# Patient Record
Sex: Male | Born: 1965 | Race: White | Hispanic: No | Marital: Married | Smoking: Never smoker
Health system: Southern US, Community
[De-identification: ages and names within clinical notes are randomized; demographics above are authoritative.]

## PROBLEM LIST (undated history)

## (undated) DIAGNOSIS — N210 Calculus in bladder: Secondary | ICD-10-CM

## (undated) DIAGNOSIS — R531 Weakness: Secondary | ICD-10-CM

## (undated) DIAGNOSIS — M549 Dorsalgia, unspecified: Secondary | ICD-10-CM

## (undated) DIAGNOSIS — G8220 Paraplegia, unspecified: Secondary | ICD-10-CM

## (undated) DIAGNOSIS — Z8709 Personal history of other diseases of the respiratory system: Secondary | ICD-10-CM

## (undated) DIAGNOSIS — Z9289 Personal history of other medical treatment: Secondary | ICD-10-CM

## (undated) HISTORY — PX: HERNIA REPAIR: SHX51

## (undated) HISTORY — PX: OTHER SURGICAL HISTORY: SHX169

## (undated) HISTORY — DX: Paraplegia, unspecified: G82.20

## (undated) HISTORY — PX: FRACTURE SURGERY: SHX138

---

## 2013-10-09 ENCOUNTER — Ambulatory Visit (INDEPENDENT_AMBULATORY_CARE_PROVIDER_SITE_OTHER): Payer: BC Managed Care – PPO

## 2013-10-09 ENCOUNTER — Ambulatory Visit (INDEPENDENT_AMBULATORY_CARE_PROVIDER_SITE_OTHER): Payer: BC Managed Care – PPO | Admitting: Family Medicine

## 2013-10-09 VITALS — BP 120/72 | HR 56 | Temp 97.7°F | Resp 18

## 2013-10-09 DIAGNOSIS — S82132A Displaced fracture of medial condyle of left tibia, initial encounter for closed fracture: Secondary | ICD-10-CM | POA: Insufficient documentation

## 2013-10-09 DIAGNOSIS — M25469 Effusion, unspecified knee: Secondary | ICD-10-CM

## 2013-10-09 DIAGNOSIS — S99929A Unspecified injury of unspecified foot, initial encounter: Secondary | ICD-10-CM

## 2013-10-09 DIAGNOSIS — S8990XA Unspecified injury of unspecified lower leg, initial encounter: Secondary | ICD-10-CM

## 2013-10-09 DIAGNOSIS — G839 Paralytic syndrome, unspecified: Secondary | ICD-10-CM

## 2013-10-09 DIAGNOSIS — S82142A Displaced bicondylar fracture of left tibia, initial encounter for closed fracture: Secondary | ICD-10-CM

## 2013-10-09 DIAGNOSIS — S82109A Unspecified fracture of upper end of unspecified tibia, initial encounter for closed fracture: Secondary | ICD-10-CM

## 2013-10-09 DIAGNOSIS — G8220 Paraplegia, unspecified: Secondary | ICD-10-CM

## 2013-10-09 DIAGNOSIS — S99919A Unspecified injury of unspecified ankle, initial encounter: Secondary | ICD-10-CM
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
## 2013-10-09 NOTE — Patient Instructions (Addendum)
There appears to be a break in the inside part of the left tibia/knee.  We will discuss this with ortho and give you a call today. Keep ace bandage in place and minimal movement of this knee as possible until further treatment discussed.

## 2013-10-13 ENCOUNTER — Ambulatory Visit (INDEPENDENT_AMBULATORY_CARE_PROVIDER_SITE_OTHER): Payer: BC Managed Care – PPO | Admitting: Family Medicine

## 2013-10-13 VITALS — HR 48 | Temp 97.3°F | Resp 14

## 2013-10-13 DIAGNOSIS — R319 Hematuria, unspecified: Principal | ICD-10-CM

## 2013-10-13 DIAGNOSIS — N390 Urinary tract infection, site not specified: Secondary | ICD-10-CM

## 2013-10-13 LAB — POCT URINALYSIS DIPSTICK
Bilirubin, UA: NEGATIVE
Glucose, UA: NEGATIVE
NITRITE UA: POSITIVE
PH UA: 7
PROTEIN UA: 30
Spec Grav, UA: 1.02
UROBILINOGEN UA: 1

## 2013-10-13 LAB — POCT UA - MICROSCOPIC ONLY
CASTS, UR, LPF, POC: NEGATIVE
Crystals, Ur, HPF, POC: NEGATIVE
Mucus, UA: NEGATIVE
YEAST UA: NEGATIVE

## 2013-10-13 MED ORDER — NITROFURANTOIN MONOHYD MACRO 100 MG PO CAPS: 100.0000 mg | ORAL_CAPSULE | Freq: Two times a day (BID) | ORAL | Status: DC

## 2013-10-13 NOTE — Patient Instructions (Signed)
It does look and sound like you have a UTI.  We will send this for culture.  Take the Macrobid twice daily for 7 days.    If you start having nausea, vomiting, or feeling worse, don't hesitate to come back.   It was good to meet you today
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
## 2013-10-16 LAB — URINE CULTURE

## 2013-10-22 ENCOUNTER — Ambulatory Visit (INDEPENDENT_AMBULATORY_CARE_PROVIDER_SITE_OTHER): Payer: BC Managed Care – PPO

## 2013-10-22 ENCOUNTER — Ambulatory Visit (INDEPENDENT_AMBULATORY_CARE_PROVIDER_SITE_OTHER): Payer: BC Managed Care – PPO | Admitting: Family Medicine

## 2013-10-22 ENCOUNTER — Encounter: Payer: Self-pay | Admitting: Family Medicine

## 2013-10-22 ENCOUNTER — Other Ambulatory Visit: Payer: Self-pay | Admitting: Family Medicine

## 2013-10-22 VITALS — BP 128/72 | HR 57 | Temp 98.8°F | Resp 16

## 2013-10-22 DIAGNOSIS — S82132D Displaced fracture of medial condyle of left tibia, subsequent encounter for closed fracture with routine healing: Secondary | ICD-10-CM

## 2013-10-22 DIAGNOSIS — S8290XD Unspecified fracture of unspecified lower leg, subsequent encounter for closed fracture with routine healing: Secondary | ICD-10-CM

## 2013-10-22 NOTE — Patient Instructions (Signed)
Xray looks overall stable.  recheck in 4 weeks, or if channges noted - return in 2 weeks.  I will look into provider for assessing wheelchair replacement/fitting and physical medicine and rehab provider.  Return to the clinic or go to the nearest emergency room if any of your symptoms worsen or new symptoms occur.
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
## 2013-11-01 ENCOUNTER — Ambulatory Visit (INDEPENDENT_AMBULATORY_CARE_PROVIDER_SITE_OTHER): Payer: BC Managed Care – PPO | Admitting: Family Medicine

## 2013-11-01 VITALS — BP 126/70 | HR 65 | Temp 98.0°F | Resp 16

## 2013-11-01 DIAGNOSIS — N390 Urinary tract infection, site not specified: Secondary | ICD-10-CM

## 2013-11-01 DIAGNOSIS — T83511A Infection and inflammatory reaction due to indwelling urethral catheter, initial encounter: Secondary | ICD-10-CM

## 2013-11-01 LAB — POCT URINALYSIS DIPSTICK
Bilirubin, UA: NEGATIVE
Glucose, UA: NEGATIVE
Ketones, UA: NEGATIVE
NITRITE UA: NEGATIVE
PH UA: 5
PROTEIN UA: NEGATIVE
Spec Grav, UA: <=1.005
Urobilinogen, UA: 0.2

## 2013-11-01 LAB — POCT UA - MICROSCOPIC ONLY
Casts, Ur, LPF, POC: NEGATIVE
Crystals, Ur, HPF, POC: NEGATIVE
Yeast, UA: NEGATIVE

## 2013-11-01 MED ORDER — CIPROFLOXACIN HCL 500 MG PO TABS: 500.0000 mg | ORAL_TABLET | Freq: Two times a day (BID) | ORAL | Status: DC

## 2013-11-01 NOTE — Patient Instructions (Addendum)
We will start antibiotic - I will call you with which one. You should receive a call or letter about your lab results within the next week to 10 days.  If dark urine persists tomorrow or next day - return as we may need to check blood test for muscle enzyme. Return to the clinic or go to the nearest emergency room if any of your symptoms worsen or new symptoms occur.  If infection recurs in next 2 months - will plan on urology evaluation.  Urinary Tract Infection Urinary tract infections (UTIs) can develop anywhere along your urinary tract. Your urinary tract is your body's drainage system for removing wastes and extra water. Your urinary tract includes two kidneys, two ureters, a bladder, and a urethra. Your kidneys are a pair of bean-shaped organs. Each kidney is about the size of your fist. They are located below your ribs, one on each side of your spine. CAUSES Infections are caused by microbes, which are microscopic organisms, including fungi, viruses, and bacteria. These organisms are so small that they can only be seen through a microscope. Bacteria are the microbes that most commonly cause UTIs. SYMPTOMS  Symptoms of UTIs may vary by age and gender of the patient and by the location of the infection. Symptoms in young women typically include a frequent and intense urge to urinate and a painful, burning feeling in the bladder or urethra during urination. Older women and men are more likely to be tired, shaky, and weak and have muscle aches and abdominal pain. A fever may mean the infection is in your kidneys. Other symptoms of a kidney infection include pain in your back or sides below the ribs, nausea, and vomiting. DIAGNOSIS To diagnose a UTI, your caregiver will ask you about your symptoms. Your caregiver also will ask to provide a urine sample. The urine sample will be tested for bacteria and white blood cells. White blood cells are made by your body to help fight infection. TREATMENT    Typically, UTIs can be treated with medication. Because most UTIs are caused by a bacterial infection, they usually can be treated with the use of antibiotics. The choice of antibiotic and length of treatment depend on your symptoms and the type of bacteria causing your infection. HOME CARE INSTRUCTIONS  If you were prescribed antibiotics, take them exactly as your caregiver instructs you. Finish the medication even if you feel better after you have only taken some of the medication.  Drink enough water and fluids to keep your urine clear or pale yellow.  Avoid caffeine, tea, and carbonated beverages. They tend to irritate your bladder.  Empty your bladder often. Avoid holding urine for long periods of time.  Empty your bladder before and after sexual intercourse.  After a bowel movement, women should cleanse from front to back. Use each tissue only once. SEEK MEDICAL CARE IF:   You have back pain.  You develop a fever.  Your symptoms do not begin to resolve within 3 days. SEEK IMMEDIATE MEDICAL CARE IF:   You have severe back pain or lower abdominal pain.  You develop chills.  You have nausea or vomiting.  You have continued burning or discomfort with urination. MAKE SURE YOU:   Understand these instructions.  Will watch your condition.  Will get help right away if you are not doing well or get worse. Document Released: 01/20/2005 Document Revised: 10/12/2011 Document Reviewed: 05/21/2011 [facility] Patient Information 2015 [place]. This information is not intended to replace  advice given to you by your health care provider. Make sure you discuss any questions you have with your health care provider.  

## 2013-11-01 NOTE — Progress Notes (Addendum)
Subjective:  This chart was scribed for [name], MD by Andrew Au, ED Scribe. This patient was seen in room 14 and the patient's care was started at 10:09 AM.  Authored by Silas Sacramento, MD - unable to change in [facility].   Patient ID: Charles Hansen, male    DOB: 1965-08-15, 48 y.o.   MRN: [id number]  Urinary Tract Infection  Pertinent negatives include no chills or hematuria.   Chief Complaint  Patient presents with   Urinary Tract Infection   HPI Comments: Charles Hansen is a 48 y.o. male who presents to the [facility] complaining of a UTI x  Seen was diagnosed with UTI on June 20 by Dr. Gordy Levan. He had cloudy and foul smelling urine 3-4 days at that time. H/o T9 peroplasia. self catheterizes. Pos culture for klebsiella. sensitive to Macrobid that he was prescribed 7 day coarse.  Today, pt states this bladder infection is worse than past infection.  He reports clear urine 1 day ago but woke up today with cloudy, tea color urine with associated lower back muscle spasm. Pt reports self catheterizes about 4-6 times day. Pt denies taking OTC medication. He reports he's been off 7 day coarse for a couple weeks and reports he did well with medication. Pt report h/o bladder stone. Pt denies h/o kidney stones. Pt reports working out 1 day ago but denies extreme changes in activity.  Pt denies decreased in urine.   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 No pertinent past medical history. Past Surgical History  Procedure Laterality Date   Fracture surgery     Hernia repair     Allergies  Allergen Reactions   Bactrim [Sulfamethoxazole-Tmp Ds] Hives and Rash   Prior to Admission medications   Medication Sig Start Date End Date Taking? Authorizing Provider  nitrofurantoin, macrocrystal-monohydrate, (MACROBID) 100 MG capsule Take 1 capsule (100 mg total) by mouth 2 (two) times daily. 10/13/13    Tobey Grim, MD   History   Social History   Marital Status: Married    Spouse Name: N/A    Number of Children: N/A   Years of Education: N/A   Occupational History   Not on file.   Social History Main Topics   Smoking status: Never Smoker    Smokeless tobacco: Not on file   Alcohol Use: No   Drug Use: No   Sexual Activity: Not on file   Other Topics Concern   Not on file   Social History Narrative   No narrative on file   Review of Systems  Constitutional: Negative for fever and chills.  Genitourinary: Negative for hematuria, decreased urine volume and difficulty urinating.      Objective:   Physical Exam  Nursing note and vitals reviewed. Constitutional: He is oriented to person, place, and time. He appears well-developed and well-nourished. No distress.  HENT:  Head: Normocephalic and atraumatic.  Eyes: Conjunctivae and EOM are normal.  Neck: Neck supple.  Cardiovascular: Normal rate.   Pulmonary/Chest: Effort normal.  Abdominal: Soft. Bowel sounds are normal. There is no tenderness. There is no CVA tenderness.  Musculoskeletal: Normal range of motion.  Neurological: He is alert and oriented to person, place, and time.  Skin: Skin is warm and dry.  Psychiatric: He has a normal mood and affect. His behavior is normal.   Results for orders placed in visit on 11/01/13  POCT URINALYSIS DIPSTICK      Result Value Ref Range   Color, UA yellow     Clarity, UA cloudy     Glucose, UA neg     Bilirubin, UA neg     Ketones, UA neg     Spec Grav, UA <=1.005     Blood, UA large     pH, UA 5.0     Protein, UA neg     Urobilinogen, UA 0.2     Nitrite, UA neg     Leukocytes, UA large (3+)    POCT UA - MICROSCOPIC ONLY      Result Value Ref Range   WBC, Ur, HPF, POC TNTC     RBC, urine, microscopic 5-7     Bacteria, U Microscopic 1+     Mucus, UA trace     Epithelial cells, urine per micros 0-1     Crystals, Ur, HPF, POC neg     Casts, Ur, LPF,  POC neg     Yeast, UA neg         Assessment & Plan:   Charles Hansen is a 48 y.o. male Urinary tract infection associated with catheterization of urinary tract, initial encounter - Plan: POCT urinalysis dipstick, POCT UA - Microscopic Only, Urine culture, ciprofloxacin (CIPRO) 500 MG tablet   - recurrent uti vs new episode. Urine cx pending, start cipro - 10 day course, and if not improving in few days - especially if dark urine persists, rtc for eval.  Consider CK level if persistent dark urine in few days. If another recurrence in next 2 months - consider urology eval for recurrent uti.   rtc precautions.    Meds ordered this encounter  Medications   ciprofloxacin (CIPRO) 500 MG tablet    Sig: Take 1 tablet (500 mg total) by mouth 2 (two) times daily.    Dispense:  20 tablet    Refill:  0   Patient Instructions  We will start antibiotic - I will call you with which one. You should receive a call or letter about your lab results within the next week to 10 days.  If dark urine persists tomorrow or next day - return as we may need to check blood test for muscle enzyme. Return to the clinic or go to the nearest emergency room if any of your symptoms worsen or new symptoms occur.  If infection recurs in next 2 months - will plan on urology evaluation.  Urinary Tract Infection Urinary tract infections (UTIs) can develop anywhere along your urinary tract. Your urinary tract is your body's drainage system for removing wastes and extra water. Your urinary tract includes two kidneys, two ureters, a bladder, and a urethra. Your kidneys are a pair of bean-shaped organs. Each kidney is about the size of your fist. They are located below your ribs, one on each side of your spine. CAUSES Infections are caused by microbes, which are microscopic organisms, including fungi, viruses, and bacteria. These organisms are so small that they can only be seen through a microscope. Bacteria are the microbes  that most commonly cause UTIs. SYMPTOMS  Symptoms of UTIs may vary by age and gender of the patient and by the location of the infection. Symptoms in young women typically include a frequent and intense urge to urinate and a painful, burning feeling in the bladder or urethra during urination. Older women and men are more likely to be tired, shaky, and weak and have muscle aches  and abdominal pain. A fever may mean the infection is in your kidneys. Other symptoms of a kidney infection include pain in your back or sides below the ribs, nausea, and vomiting. DIAGNOSIS To diagnose a UTI, your caregiver will ask you about your symptoms. Your caregiver also will ask to provide a urine sample. The urine sample will be tested for bacteria and white blood cells. White blood cells are made by your body to help fight infection. TREATMENT  Typically, UTIs can be treated with medication. Because most UTIs are caused by a bacterial infection, they usually can be treated with the use of antibiotics. The choice of antibiotic and length of treatment depend on your symptoms and the type of bacteria causing your infection. HOME CARE INSTRUCTIONS  If you were prescribed antibiotics, take them exactly as your caregiver instructs you. Finish the medication even if you feel better after you have only taken some of the medication.  Drink enough water and fluids to keep your urine clear or pale yellow.  Avoid caffeine, tea, and carbonated beverages. They tend to irritate your bladder.  Empty your bladder often. Avoid holding urine for long periods of time.  Empty your bladder before and after sexual intercourse.  After a bowel movement, women should cleanse from front to back. Use each tissue only once. SEEK MEDICAL CARE IF:   You have back pain.  You develop a fever.  Your symptoms do not begin to resolve within 3 days. SEEK IMMEDIATE MEDICAL CARE IF:   You have severe back pain or lower abdominal pain.  You  develop chills.  You have nausea or vomiting.  You have continued burning or discomfort with urination. MAKE SURE YOU:   Understand these instructions.  Will watch your condition.  Will get help right away if you are not doing well or get worse. Document Released: 01/20/2005 Document Revised: 10/12/2011 Document Reviewed: 05/21/2011 [facility] Patient Information 2015 [place], [place]. This information is not intended to replace advice given to you by your health care provider. Make sure you discuss any questions you have with your health care provider.   I personally performed the services described in this documentation, which was scribed in my presence. The recorded information has been reviewed and considered, and addended by me as needed.

## 2013-11-03 LAB — URINE CULTURE

## 2013-11-19 ENCOUNTER — Encounter: Payer: Self-pay | Admitting: Family Medicine

## 2013-11-19 ENCOUNTER — Ambulatory Visit (INDEPENDENT_AMBULATORY_CARE_PROVIDER_SITE_OTHER): Payer: BC Managed Care – PPO | Admitting: Family Medicine

## 2013-11-19 ENCOUNTER — Ambulatory Visit (INDEPENDENT_AMBULATORY_CARE_PROVIDER_SITE_OTHER): Payer: BC Managed Care – PPO

## 2013-11-19 VITALS — BP 120/60 | HR 58 | Temp 98.2°F | Resp 16

## 2013-11-19 DIAGNOSIS — S82132D Displaced fracture of medial condyle of left tibia, subsequent encounter for closed fracture with routine healing: Secondary | ICD-10-CM

## 2013-11-19 DIAGNOSIS — G8220 Paraplegia, unspecified: Secondary | ICD-10-CM

## 2013-11-19 DIAGNOSIS — G839 Paralytic syndrome, unspecified: Secondary | ICD-10-CM

## 2013-11-19 DIAGNOSIS — S8290XD Unspecified fracture of unspecified lower leg, subsequent encounter for closed fracture with routine healing: Secondary | ICD-10-CM

## 2013-11-19 NOTE — Progress Notes (Signed)
Subjective:  This chart was scribed for Charles StaggersJeffrey Kallista Pae, MD by Charles Hansen, Medial Scribe. The patient's care was started at 2:08 PM.    Patient ID: Charles Hansen, male    DOB: 02/27/1966, [age] y.o.   MRN: [id number]  HPI HPI Comments: Charles Hansen is a 48 y.o. male PCP: No PCP Per Patient  Left knee injury Patient presents to the [facility] requesting follow up for left knee injury incurred 10/06/13. Patient diagnosed with medial tibial plateau fracture. Repeat X-ray 10/22/13 overall stable without apparent displacement. Today patient is 6 weeks post injury. Patient generally reports improvement of the left knee. States that he is diligent about wearing his compression sock and reports decreased swelling. He however reports continued sensitivity that he notices when touching the knee.   Urinary Tract Infection Patient denies futher urinary symptoms.      Patient Active Problem List   Diagnosis Date Noted  . 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 Past Surgical History  Procedure Laterality Date  . Fracture surgery    . Hernia repair     Allergies  Allergen Reactions  . Bactrim [Sulfamethoxazole-Tmp Ds] Hives and Rash   Prior to Admission medications   Medication Sig Start Date End Date Taking? Authorizing Provider  ciprofloxacin (CIPRO) 500 MG tablet Take 1 tablet (500 mg total) by mouth 2 (two) times daily. 11/01/13   Charles FloodJeffrey R Alquan Morrish, MD  nitrofurantoin, macrocrystal-monohydrate, (MACROBID) 100 MG capsule Take 1 capsule (100 mg total) by mouth 2 (two) times daily. 10/13/13   Charles GrimJeffrey H Walden, MD   History   Social History  . Marital Status: Married    Spouse Name: N/A    Number of Children: N/A  . Years of Education: N/A   Occupational History  . Not on file.   Social History Main Topics  . Smoking status: Never Smoker   . Smokeless tobacco: Not on file  . Alcohol Use: No  . Drug Use: No  . Sexual Activity: Not on  file   Other Topics Concern  . Not on file   Social History Narrative  . No narrative on file      Review of Systems  Constitutional: Negative for fever and chills.  Gastrointestinal: Negative for nausea.  Genitourinary: Negative for dysuria, urgency and frequency.  Musculoskeletal: Positive for joint swelling. Negative for back pain.       Objective:   Physical Exam  Nursing note and vitals reviewed. Constitutional: He is oriented to person, place, and time. He appears well-developed and well-nourished. No distress.  Patient is in wheel chair.   HENT:  Head: Normocephalic and atraumatic.  Eyes: EOM are normal.  Neck: Neck supple.  Cardiovascular: Normal rate.   Pulmonary/Chest: Effort normal. No respiratory distress.  Musculoskeletal: He exhibits no edema.  Left knee: Faint effusion. Able to passively extend to lacking to approximately 30 deg with underlying contracture. Full flexion. No ecchymosis. Skin is intact. No edema under the knee.  Toes warm. Cap refill at toes < 1 sec.   Neurological: He is alert and oriented to person, place, and time.  Skin: Skin is warm and dry.  Psychiatric: He has a normal mood and affect. His behavior is normal.    Filed Vitals:   11/19/13 1344  BP: 120/60  Pulse: 58  Temp: 98.2 F (36.8 C)  TempSrc: Oral  Resp: 16  SpO2: 99%    [facility] reading (PRIMARY) by  Dr.  Neva Seat. Left knee: left medial tibial plateau fracture with callous formation. No apparent displacement or interval change.    Reading from radiology: FINDINGS:  There is a nondisplaced medial tibial plateau fracture. There is  diffuse osteopenia. There is no significant change when compared to  the prior study. Stable nondisplaced medial tibial plateau fracture.       Assessment & Plan:  Charles Hansen is a 48 y.o. male Medial plateau fracture, left, closed, with routine healing, subsequent encounter - Plan: DG Knee Complete 4 Views Left,   -stable. Plan on  continued relative avoidance of moving this knee and caution with transfers. Plan on repeat eval in 4 weeks.   Paraplegia at T9 level - Plan: DME Wheelchair manual, Ambulatory Referral to Neuro Rehab  -needs new W/C.  Referred to [facility] for selection of W?C and the if needed to neuro rehab for chair fitting.   No orders of the defined types were placed in this encounter.   Patient Instructions  We will place an order for [facility] to choose wheelchair vendor, then once that is chosen, you will need an evaluation with neurorehab.  We will go ahead and place those orders as neurorehab appointments are booking out several weeks at this point. Let me know if any questions.   Your knee fracture appears stable.  Stocking only if needed for swelling, continue to be cautious in transfers and movement of this joint for the next few weeks.  I will watch for the xray reading to see if we need another follow up in about 4 weeks.   Return to the clinic or go to the nearest emergency room if any of your symptoms worsen or new symptoms occur.

## 2013-11-19 NOTE — Patient Instructions (Signed)
We will place an order for [facility] to choose wheelchair vendor, then once that is chosen, you will need an evaluation with neurorehab.  We will go ahead and place those orders as neurorehab appointments are booking out several weeks at this point. Let me know if any questions.   Your knee fracture appears stable.  Stocking only if needed for swelling, continue to be cautious in transfers and movement of this joint for the next few weeks.  I will watch for the xray reading to see if we need another follow up in about 4 weeks.   Return to the clinic or go to the nearest emergency room if any of your symptoms worsen or new symptoms occur.

## 2013-11-21 ENCOUNTER — Encounter: Payer: Self-pay | Admitting: Family Medicine

## 2013-12-24 ENCOUNTER — Ambulatory Visit (INDEPENDENT_AMBULATORY_CARE_PROVIDER_SITE_OTHER): Payer: BC Managed Care – PPO

## 2013-12-24 ENCOUNTER — Encounter: Payer: Self-pay | Admitting: Family Medicine

## 2013-12-24 ENCOUNTER — Ambulatory Visit (INDEPENDENT_AMBULATORY_CARE_PROVIDER_SITE_OTHER): Payer: BC Managed Care – PPO | Admitting: Family Medicine

## 2013-12-24 VITALS — BP 115/59 | HR 58 | Temp 98.6°F | Resp 16 | Wt 128.6 lb

## 2013-12-24 DIAGNOSIS — S8290XS Unspecified fracture of unspecified lower leg, sequela: Secondary | ICD-10-CM

## 2013-12-24 DIAGNOSIS — S82132S Displaced fracture of medial condyle of left tibia, sequela: Secondary | ICD-10-CM

## 2013-12-24 NOTE — Progress Notes (Signed)
Subjective:  This chart was scribed for Charles Staggers, MD by Carl Best, Medical Scribe. This patient was seen in Room 24 and the patient's care was started at 3:50 PM.   Patient ID: Charles Hansen, male    DOB: 11/18/1965, 48 y.o.   MRN: [id number]  HPI HPI Comments: Charles Hansen is a 48 y.o. male who presents to the [facility] for a follow-up.  He has a history of paraplegia at T9.  Seen initially for left medial tibial plateau fracture that occurred 10/06/13.  Most recent x-ray was on July 27th.  The fracture was stable and nondisplaced without significant changes.  Here for repeat x-ray.    There have been no changes in the swelling to his left knee.  He is not experiencing any problems and his range of motion has improved.  The piloerection and diaphoresis are gradually resolving.  He has not worn the compression stocking for the last 2-3 weeks.  He has not had any left ankle swelling for the last couple of weeks.    Patient Active Problem List   Diagnosis Date Noted  . Paraplegia at T9 level 10/09/2013  . Closed fracture of medial plateau of left tibia 10/09/2013   No past medical history on file. Past Surgical History  Procedure Laterality Date  . Fracture surgery    . Hernia repair     Allergies  Allergen Reactions  . Bactrim [Sulfamethoxazole-Tmp Ds] Hives and Rash   Prior to Admission medications   Medication Sig Start Date End Date Taking? Authorizing Provider  ciprofloxacin (CIPRO) 500 MG tablet Take 1 tablet (500 mg total) by mouth 2 (two) times daily. 11/01/13   Shade Flood, MD  nitrofurantoin, macrocrystal-monohydrate, (MACROBID) 100 MG capsule Take 1 capsule (100 mg total) by mouth 2 (two) times daily. 10/13/13   Tobey Grim, MD   History   Social History  . Marital Status: Married    Spouse Name: N/A    Number of Children: N/A  . Years of Education: N/A   Occupational History  . Not on file.   Social History Main Topics  .  Smoking status: Never Smoker   . Smokeless tobacco: Not on file  . Alcohol Use: No  . Drug Use: No  . Sexual Activity: Not on file   Other Topics Concern  . Not on file   Social History Narrative  . No narrative on file     Review of Systems  Musculoskeletal: Positive for joint swelling.    Objective:  Physical Exam  Vitals reviewed. Constitutional: He is oriented to person, place, and time. He appears well-developed and well-nourished.  HENT:  Head: Normocephalic and atraumatic.  Eyes: EOM are normal. Pupils are equal, round, and reactive to light.  Neck: No JVD present. Carotid bruit is not present.  Cardiovascular: Normal rate, regular rhythm and normal heart sounds.   No murmur heard. Pulmonary/Chest: Effort normal and breath sounds normal. He has no rales.  Musculoskeletal: He exhibits no edema.  Left knee - no significant effusion.  Skin is intact.  No dependent edema.  No ecchymosis.  Intact ROM.     Neurological: He is alert and oriented to person, place, and time.  Skin: Skin is warm and dry.  Psychiatric: He has a normal mood and affect.   [facility] preliminary x-ray report read by Dr. Neva Seat: Left knee.  Medial tibia plateau fracture.  Stable with possible small area of callous.  No apparent change  from prior study.   Filed Vitals:   12/24/13 1453  BP: 115/59  Pulse: 58  Temp: 98.6 F (37 C)  TempSrc: Oral  Resp: 16  Weight: 128 lb 9.6 oz (58.333 kg)  SpO2: 97%   Assessment & Plan:   Charles Hansen is a 48 y.o. male Closed fracture of medial plateau of left tibia, sequela - Plan: DG Knee Complete 4 Views Left  -appears stable on XR, clinically stable.  No new changes.  likely can defer further imaging at this point. overread pending.   appt pending for new wheelchair fitting as below.  No orders of the defined types were placed in this encounter.   Patient Instructions  [facility] can see you for the Wheelchair evaluation on October 22nd  at 8:45 am / the appointment is with Robin. They are located at [address] [employer] [address]. You will get some information in the mail concerning this appointment.   No further xray needed at this point, but if new swelling or other changes - let me know.    I personally performed the services described in this documentation, which was scribed in my presence. The recorded information has been reviewed and considered, and addended by me as needed.

## 2013-12-24 NOTE — Patient Instructions (Addendum)
[facility] can see you for the Wheelchair evaluation on October 22nd at 8:45 am / the appointment is with Robin. They are located at [address] [employer] [address]. You will get some information in the mail concerning this appointment.   No further xray needed at this point, but if new swelling or other changes - let me know.

## 2014-02-14 ENCOUNTER — Ambulatory Visit: Payer: 59 | Admitting: Physical Therapy

## 2014-04-11 ENCOUNTER — Ambulatory Visit: Payer: 59 | Attending: Family Medicine | Admitting: Physical Therapy

## 2014-04-11 DIAGNOSIS — R293 Abnormal posture: Secondary | ICD-10-CM

## 2014-04-11 DIAGNOSIS — G839 Paralytic syndrome, unspecified: Secondary | ICD-10-CM | POA: Insufficient documentation

## 2014-04-11 DIAGNOSIS — G8220 Paraplegia, unspecified: Secondary | ICD-10-CM

## 2014-04-11 NOTE — Therapy (Signed)
[facility] [address] [place], [place], [address] Phone: [phone]   Fax:  [phone]  Patient Details  Name: Charles Hansen MRN: [id number] Date of Birth: 03/17/1966  Encounter Date: 04/11/2014  See scanned assessment for complete wheelchair evaluation for this date of service.  PT awaiting complete wheelchair component specifications upon pt's additional meeting with [facility], [facility].  [employer] W. 04/11/2014, 2:06 PM   Jahir Halt, PT 04/11/2014 2:08 PM Phone: [phone] Fax: [phone]

## 2014-05-22 ENCOUNTER — Telehealth: Payer: Self-pay

## 2014-05-22 DIAGNOSIS — G8220 Paraplegia, unspecified: Secondary | ICD-10-CM

## 2014-05-22 MED ORDER — UNABLE TO FIND: Status: DC

## 2014-05-22 NOTE — Telephone Encounter (Signed)
Pt LM on VM asking Dr Neva SeatGreene if he can write a Rx for hand controls to be put on auto. He can take off taxes if he has a Rx w/ Dx of paraplegia. I have pended a Rx for review.

## 2014-05-22 NOTE — Telephone Encounter (Signed)
Notified pt ready. [facility]ed to [employer] [facility] [employer] who are installing hand controls and put orig Rx in p/up drawer for pt per pt req.

## 2014-05-22 NOTE — Telephone Encounter (Signed)
Done.,  Can sign later today when in office. Thanks. -JG

## 2014-08-05 ENCOUNTER — Ambulatory Visit (INDEPENDENT_AMBULATORY_CARE_PROVIDER_SITE_OTHER): Payer: 59 | Admitting: Family Medicine

## 2014-08-05 ENCOUNTER — Encounter: Payer: Self-pay | Admitting: Family Medicine

## 2014-08-05 VITALS — BP 113/67 | HR 60 | Temp 98.6°F | Resp 16 | Wt 131.0 lb

## 2014-08-05 DIAGNOSIS — M4127 Other idiopathic scoliosis, lumbosacral region: Secondary | ICD-10-CM | POA: Diagnosis not present

## 2014-08-05 DIAGNOSIS — G839 Paralytic syndrome, unspecified: Secondary | ICD-10-CM | POA: Diagnosis not present

## 2014-08-05 DIAGNOSIS — M955 Acquired deformity of pelvis: Secondary | ICD-10-CM

## 2014-08-05 DIAGNOSIS — M67912 Unspecified disorder of synovium and tendon, left shoulder: Secondary | ICD-10-CM

## 2014-08-05 DIAGNOSIS — M419 Scoliosis, unspecified: Secondary | ICD-10-CM

## 2014-08-05 DIAGNOSIS — M25512 Pain in left shoulder: Secondary | ICD-10-CM | POA: Diagnosis not present

## 2014-08-05 DIAGNOSIS — G8220 Paraplegia, unspecified: Secondary | ICD-10-CM

## 2014-08-05 NOTE — Progress Notes (Addendum)
Subjective:  This chart was scribed for [name], MD by [facility], medical scribe at [facility].The patient was seen in exam room 24 and the patient's care was started at 3:13 PM.   Patient ID: Charles Hansen, male    DOB: [birth year], 49 y.o.   MRN: [id number]   Chief Complaint  Patient presents with  . Hip Pain    feels like hips are shifting and legs are starting to sweat  . Shoulder Pain    left shoulder pain that is constant   HPI  HPI Comments: Charles Hansen is a 49 y.o. male who presents to [facility] complaining of an abnormal hip sensation and left shoulder pain. 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 He uses a wheelchair.   Abnormal Hip Sensation: Last 8 weeks he has noticed a pelvic tilt to left and slightly inward. He is unsure when this actually happened. This has happened before between 2004-2006 the pt states he "adapted" and did not have any chiropractor work done. He has increased leg spasms, sweating, and abdominal muscle tension associated with the tilt.  Left Shoulder Pain: Pt states he has pain with certain movements and is gradually worsening. He believes it is due to increased repetitive motions. He is ambidextrous. He has notice decreased ROM. Pt denies trauma. Pt also complains of intermittent numbness in his hands, he denies neck pain. Aleve has provided relief, which he takes 1-2 a week. No history of stomach ulcers.  Patient Active Problem List   Diagnosis Date Noted  . Paraplegia at T9 level 10/09/2013  . Closed fracture of medial plateau of left tibia 10/09/2013   No past medical history on file. Past Surgical History  Procedure Laterality Date  . Fracture surgery    . Hernia repair     Allergies  Allergen Reactions  . Bactrim [Sulfamethoxazole-Trimethoprim] Hives and Rash   Prior to Admission medications   Medication Sig Start Date End Date Taking? Authorizing Provider  UNABLE TO  FIND Hand controls for automobile. Dx code: G83.9 05/22/14   Shade Flood, MD   History   Social History  . Marital Status: Married    Spouse Name: N/A  . Number of Children: N/A  . Years of Education: N/A   Occupational History  . Not on file.   Social History Main Topics  . Smoking status: Never Smoker   . Smokeless tobacco: Not on file  . Alcohol Use: No  . Drug Use: No  . Sexual Activity: Not on file   Other Topics Concern  . Not on file   Social History Narrative   Review of Systems  Musculoskeletal: Positive for arthralgias. Negative for neck pain.  Neurological: Positive for numbness.      Objective:  BP 113/67 mmHg  Pulse 60  Temp(Src) 98.6 F (37 C) (Oral)  Resp 16  Wt 131 lb (59.421 kg)  SpO2 98% Physical Exam  Constitutional: He is oriented to person, place, and time. He appears well-developed and well-nourished. No distress.  HENT:  Head: Normocephalic and atraumatic.  Eyes: Pupils are equal, round, and reactive to light.  Neck: Normal range of motion.  Cardiovascular: Normal rate and regular rhythm.   Pulmonary/Chest: Effort normal. No respiratory distress.  Musculoskeletal: Normal range of motion.  On frontal view: left pelvis seems to be tilted downward and slightly inward at the AS IS node compared to the right. There is  an Intact healed scar from upper thoracic to the upper lumbar area with bony prominence of the upper lumbar region with forward flexion.  There is also a well healed scar at the inferior SI area from a bone graft. Slight bony prominence  underlying the scar under the SI area with prominence in this area inferior to midline. Posteriorly the sacrum also appears lower than the right with forward flexion of the trunk. Pain of left arm with full abduction and external rotation In the pronation position he has noticed more pain. C-spine full ROM, no reproduction of motion with C-spine rotation. Full ROM of bilateral shoulders with  slight reproduction of pain with external rotation of the left shoulder. Positive O'Brien but minimal change with pronation, negative cross over, negative Neer's, negative Hawkins, negative empty can.  Neurological: He is alert and oriented to person, place, and time.  Reflex Scores:      Tricep reflexes are 2+ on the right side and 2+ on the left side.      Bicep reflexes are 2+ on the right side and 2+ on the left side.      Brachioradialis reflexes are 2+ on the right side and 2+ on the left side. Skin: Skin is warm and dry.  Psychiatric: He has a normal mood and affect. His behavior is normal.  Nursing note and vitals reviewed.     Assessment & Plan:  Charles Hansen is a 49 y.o. 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 Now with development of pelvic tilt over past 8 weeks and some somatic changes on extremities that may be in response to change in pelvic positioning.   -discussed with ortho/spine specialist.  Will refer to Dr. Yevette Edwardsumonski at [facility] for evaluation and mgt options.  We deferred XR in office as NKI, and will have imaging at ortho. Called pt and advised plan Tuesday 08/06/14.   Pain in joint, shoulder region, left, Tendinopathy of rotator cuff, left  - suspected RTC tendinosis/tendininitis by hx.  Positive obriens on exam - DDX includes labral pathology/injury. Options discussed.  Will start with otc alleve, HEP by handout for rotator cuff. If not improving in next few weeks - consider imaging and possible injection at that time.   Will also be returning for new wheelchair fitting with rep from wheelchair company and PT/OT if needed. Can schedule as appt (preferably 30min appt) if needed. He will advise me of this.  No orders of the defined types were placed in this encounter.   Patient Instructions  I will talk with ortho to determine if back specialist would be next step in evaluation of the  shift in your pelvis.  Try alleve for shoulder pain, exercises below and if not improving in next 3-4 weeks - consider xray and possible injection.   Let me know if my help is needed in scheduling the new wheelchair fitting.   Return to the clinic or go to the nearest emergency room if any of your symptoms worsen or new symptoms occur.  Rotator Cuff Tendinitis  Rotator cuff tendinitis is inflammation of the tough, cord-like bands that connect muscle to bone (tendons) in your rotator cuff. Your rotator cuff is the collection of all the muscles and tendons that connect your arm to your shoulder. Your rotator cuff holds the head of your upper arm bone (humerus) in the cup (fossa) of your shoulder blade (scapula). CAUSES Rotator cuff tendinitis is  usually caused by overusing the joint involved.  SIGNS AND SYMPTOMS  Deep ache in the shoulder also felt on the outside upper arm over the shoulder muscle.  Point tenderness over the area that is injured.  Pain comes on gradually and becomes worse with lifting the arm to the side (abduction) or turning it inward (internal rotation).  May lead to a chronic tear: When a rotator cuff tendon becomes inflamed, it runs the risk of losing its blood supply, causing some tendon fibers to die. This increases the risk that the tendon can fray and partially or completely tear. DIAGNOSIS Rotator cuff tendinitis is diagnosed by taking a medical history, performing a physical exam, and reviewing results of imaging exams. The medical history is useful to help determine the type of rotator cuff injury. The physical exam will include looking at the injured shoulder, feeling the injured area, and watching you do range-of-motion exercises. X-ray exams are typically done to rule out other causes of shoulder pain, such as fractures. MRI is the imaging exam usually used for significant shoulder injuries. Sometimes a dye study called CT arthrogram is done, but it is not as  widely used as MRI. In some institutions, special ultrasound tests may also be used to aid in the diagnosis. TREATMENT  Less Severe Cases  Use of a sling to rest the shoulder for a short period of time. Prolonged use of the sling can cause stiffness, weakness, and loss of motion of the shoulder joint.  Anti-inflammatory medicines, such as ibuprofen or naproxen sodium, may be prescribed. More Severe Cases  Physical therapy.  Use of steroid injections into the shoulder joint.  Surgery. HOME CARE INSTRUCTIONS   Use a sling or splint until the pain decreases. Prolonged use of the sling can cause stiffness, weakness, and loss of motion of the shoulder joint.  Apply ice to the injured area:  Put ice in a plastic bag.  Place a towel between your skin and the bag.  Leave the ice on for 20 minutes, 2-3 times a day.  Try to avoid use other than gentle range of motion while your shoulder is painful. Use the shoulder and exercise only as directed by your health care provider. Stop exercises or range of motion if pain or discomfort increases, unless directed otherwise by your health care provider.  Only take over-the-counter or prescription medicines for pain, discomfort, or fever as directed by your health care provider.  If you were given a shoulder sling and straps (immobilizer), do not remove it except as directed, or until you see a health care provider for a follow-up exam. If you need to remove it, move your arm as little as possible or as directed.  You may want to sleep on several pillows at night to lessen swelling and pain. SEEK IMMEDIATE MEDICAL CARE IF:   Your shoulder pain increases or new pain develops in your arm, hand, or fingers and is not relieved with medicines.  You have new, unexplained symptoms, especially increased numbness in the hands or loss of strength.  You develop any worsening of the problems that brought you in for care.  Your arm, hand, or fingers are numb  or tingling.  Your arm, hand, or fingers are swollen, painful, or turn white or blue. MAKE SURE YOU:  Understand these instructions.  Will watch your condition.  Will get help right away if you are not doing well or get worse. Document Released: 07/03/2003 Document Revised: 01/31/2013 Document Reviewed: 11/22/2012 ExitCare Patient  Information 2015 [place], [place]. This information is not intended to replace advice given to you by your health care provider. Make sure you discuss any questions you have with your health care provider.  Impingement Syndrome, Rotator Cuff, Bursitis with Rehab Impingement syndrome is a condition that involves inflammation of the tendons of the rotator cuff and the subacromial bursa, that causes pain in the shoulder. The rotator cuff consists of four tendons and muscles that control much of the shoulder and upper arm function. The subacromial bursa is a fluid filled sac that helps reduce friction between the rotator cuff and one of the bones of the shoulder (acromion). Impingement syndrome is usually an overuse injury that causes swelling of the bursa (bursitis), swelling of the tendon (tendonitis), and/or a tear of the tendon (strain). Strains are classified into three categories. Grade 1 strains cause pain, but the tendon is not lengthened. Grade 2 strains include a lengthened ligament, due to the ligament being stretched or partially ruptured. With grade 2 strains there is still function, although the function may be decreased. Grade 3 strains include a complete tear of the tendon or muscle, and function is usually impaired. SYMPTOMS   Pain around the shoulder, often at the outer portion of the upper arm.  Pain that gets worse with shoulder function, especially when reaching overhead or lifting.  Sometimes, aching when not using the arm.  Pain that wakes you up at night.  Sometimes, tenderness, swelling, warmth, or redness over the affected area.  Loss of  strength.  Limited motion of the shoulder, especially reaching behind the back (to the back pocket or to unhook bra) or across your body.  Crackling sound (crepitation) when moving the arm.  Biceps tendon pain and inflammation (in the front of the shoulder). Worse when bending the elbow or lifting. CAUSES  Impingement syndrome is often an overuse injury, in which chronic (repetitive) motions cause the tendons or bursa to become inflamed. A strain occurs when a force is paced on the tendon or muscle that is greater than it can withstand. Common mechanisms of injury include: Stress from sudden increase in duration, frequency, or intensity of training.  Direct hit (trauma) to the shoulder.  Aging, erosion of the tendon with normal use.  Bony bump on shoulder (acromial spur). RISK INCREASES WITH:  Contact sports (football, wrestling, boxing).  Throwing sports (baseball, tennis, volleyball).  Weightlifting and bodybuilding.  Heavy labor.  Previous injury to the rotator cuff, including impingement.  Poor shoulder strength and flexibility.  Failure to warm up properly before activity.  Inadequate protective equipment.  Old age.  Bony bump on shoulder (acromial spur). PREVENTION   Warm up and stretch properly before activity.  Allow for adequate recovery between workouts.  Maintain physical fitness:  Strength, flexibility, and endurance.  Cardiovascular fitness.  Learn and use proper exercise technique. PROGNOSIS  If treated properly, impingement syndrome usually goes away within 6 weeks. Sometimes surgery is required.  RELATED COMPLICATIONS   Longer healing time if not properly treated, or if not given enough time to heal.  Recurring symptoms, that result in a chronic condition.  Shoulder stiffness, frozen shoulder, or loss of motion.  Rotator cuff tendon tear.  Recurring symptoms, especially if activity is resumed too soon, with overuse, with a direct blow, or  when using poor technique. TREATMENT  Treatment first involves the use of ice and medicine, to reduce pain and inflammation. The use of strengthening and stretching exercises may help reduce pain with activity. These  exercises may be performed at home or with a therapist. If non-surgical treatment is unsuccessful after more than 6 months, surgery may be advised. After surgery and rehabilitation, activity is usually possible in 3 months.  MEDICATION  If pain medicine is needed, nonsteroidal anti-inflammatory medicines (aspirin and ibuprofen), or other minor pain relievers (acetaminophen), are often advised.  Do not take pain medicine for 7 days before surgery.  Prescription pain relievers may be given, if your caregiver thinks they are needed. Use only as directed and only as much as you need.  Corticosteroid injections may be given by your caregiver. These injections should be reserved for the most serious cases, because they may only be given a certain number of times. HEAT AND COLD  Cold treatment (icing) should be applied for 10 to 15 minutes every 2 to 3 hours for inflammation and pain, and immediately after activity that aggravates your symptoms. Use ice packs or an ice massage.  Heat treatment may be used before performing stretching and strengthening activities prescribed by your caregiver, physical therapist, or athletic trainer. Use a heat pack or a warm water soak. SEEK MEDICAL CARE IF:   Symptoms get worse or do not improve in 4 to 6 weeks, despite treatment.  New, unexplained symptoms develop. (Drugs used in treatment may produce side effects.) EXERCISES  RANGE OF MOTION (ROM) AND STRETCHING EXERCISES - Impingement Syndrome (Rotator Cuff  Tendinitis, Bursitis) These exercises may help you when beginning to rehabilitate your injury. Your symptoms may go away with or without further involvement from your physician, physical therapist or athletic trainer. While completing these  exercises, remember:   Restoring tissue flexibility helps normal motion to return to the joints. This allows healthier, less painful movement and activity.  An effective stretch should be held for at least 30 seconds.  A stretch should never be painful. You should only feel a gentle lengthening or release in the stretched tissue. STRETCH - Flexion, Standing  Stand with good posture. With an underhand grip on your right / left hand, and an overhand grip on the opposite hand, grasp a broomstick or cane so that your hands are a little more than shoulder width apart.  Keeping your right / left elbow straight and shoulder muscles relaxed, push the stick with your opposite hand, to raise your right / left arm in front of your body and then overhead. Raise your arm until you feel a stretch in your right / left shoulder, but before you have increased shoulder pain.  Try to avoid shrugging your right / left shoulder as your arm rises, by keeping your shoulder blade tucked down and toward your mid-back spine. Hold for __________ seconds.  Slowly return to the starting position. Repeat __________ times. Complete this exercise __________ times per day. STRETCH - Abduction, Supine  Lie on your back. With an underhand grip on your right / left hand and an overhand grip on the opposite hand, grasp a broomstick or cane so that your hands are a little more than shoulder width apart.  Keeping your right / left elbow straight and your shoulder muscles relaxed, push the stick with your opposite hand, to raise your right / left arm out to the side of your body and then overhead. Raise your arm until you feel a stretch in your right / left shoulder, but before you have increased shoulder pain.  Try to avoid shrugging your right / left shoulder as your arm rises, by keeping your shoulder blade tucked down  and toward your mid-back spine. Hold for __________ seconds.  Slowly return to the starting position. Repeat  __________ times. Complete this exercise __________ times per day. ROM - Flexion, Active-Assisted  Lie on your back. You may bend your knees for comfort.  Grasp a broomstick or cane so your hands are about shoulder width apart. Your right / left hand should grip the end of the stick, so that your hand is positioned "thumbs-up," as if you were about to shake hands.  Using your healthy arm to lead, raise your right / left arm overhead, until you feel a gentle stretch in your shoulder. Hold for __________ seconds.  Use the stick to assist in returning your right / left arm to its starting position. Repeat __________ times. Complete this exercise __________ times per day.  ROM - Internal Rotation, Supine   Lie on your back on a firm surface. Place your right / left elbow about 60 degrees away from your side. Elevate your elbow with a folded towel, so that the elbow and shoulder are the same height.  Using a broomstick or cane and your strong arm, pull your right / left hand toward your body until you feel a gentle stretch, but no increase in your shoulder pain. Keep your shoulder and elbow in place throughout the exercise.  Hold for __________ seconds. Slowly return to the starting position. Repeat __________ times. Complete this exercise __________ times per day. STRETCH - Internal Rotation  Place your right / left hand behind your back, palm up.  Throw a towel or belt over your opposite shoulder. Grasp the towel with your right / left hand.  While keeping an upright posture, gently pull up on the towel, until you feel a stretch in the front of your right / left shoulder.  Avoid shrugging your right / left shoulder as your arm rises, by keeping your shoulder blade tucked down and toward your mid-back spine.  Hold for __________ seconds. Release the stretch, by lowering your healthy hand. Repeat __________ times. Complete this exercise __________ times per day. ROM - Internal Rotation    Using an underhand grip, grasp a stick behind your back with both hands.  While standing upright with good posture, slide the stick up your back until you feel a mild stretch in the front of your shoulder.  Hold for __________ seconds. Slowly return to your starting position. Repeat __________ times. Complete this exercise __________ times per day.  STRETCH - Posterior Shoulder Capsule   Stand or sit with good posture. Grasp your right / left elbow and draw it across your chest, keeping it at the same height as your shoulder.  Pull your elbow, so your upper arm comes in closer to your chest. Pull until you feel a gentle stretch in the back of your shoulder.  Hold for __________ seconds. Repeat __________ times. Complete this exercise __________ times per day. STRENGTHENING EXERCISES - Impingement Syndrome (Rotator Cuff Tendinitis, Bursitis) These exercises may help you when beginning to rehabilitate your injury. They may resolve your symptoms with or without further involvement from your physician, physical therapist or athletic trainer. While completing these exercises, remember:  Muscles can gain both the endurance and the strength needed for everyday activities through controlled exercises.  Complete these exercises as instructed by your physician, physical therapist or athletic trainer. Increase the resistance and repetitions only as guided.  You may experience muscle soreness or fatigue, but the pain or discomfort you are trying to eliminate should never  worsen during these exercises. If this pain does get worse, stop and make sure you are following the directions exactly. If the pain is still present after adjustments, discontinue the exercise until you can discuss the trouble with your clinician.  During your recovery, avoid activity or exercises which involve actions that place your injured hand or elbow above your head or behind your back or head. These positions stress the  tissues which you are trying to heal. STRENGTH - Scapular Depression and Adduction   With good posture, sit on a firm chair. Support your arms in front of you, with pillows, arm rests, or on a table top. Have your elbows in line with the sides of your body.  Gently draw your shoulder blades down and toward your mid-back spine. Gradually increase the tension, without tensing the muscles along the top of your shoulders and the back of your neck.  Hold for __________ seconds. Slowly release the tension and relax your muscles completely before starting the next repetition.  After you have practiced this exercise, remove the arm support and complete the exercise in standing as well as sitting position. Repeat __________ times. Complete this exercise __________ times per day.  STRENGTH - Shoulder Abductors, Isometric  With good posture, stand or sit about 4-6 inches from a wall, with your right / left side facing the wall.  Bend your right / left elbow. Gently press your right / left elbow into the wall. Increase the pressure gradually, until you are pressing as hard as you can, without shrugging your shoulder or increasing any shoulder discomfort.  Hold for __________ seconds.  Release the tension slowly. Relax your shoulder muscles completely before you begin the next repetition. Repeat __________ times. Complete this exercise __________ times per day.  STRENGTH - External Rotators, Isometric  Keep your right / left elbow at your side and bend it 90 degrees.  Step into a door frame so that the outside of your right / left wrist can press against the door frame without your upper arm leaving your side.  Gently press your right / left wrist into the door frame, as if you were trying to swing the back of your hand away from your stomach. Gradually increase the tension, until you are pressing as hard as you can, without shrugging your shoulder or increasing any shoulder discomfort.  Hold for  __________ seconds.  Release the tension slowly. Relax your shoulder muscles completely before you begin the next repetition. Repeat __________ times. Complete this exercise __________ times per day.  STRENGTH - Supraspinatus   Stand or sit with good posture. Grasp a __________ weight, or an exercise band or tubing, so that your hand is "thumbs-up," like you are shaking hands.  Slowly lift your right / left arm in a "V" away from your thigh, diagonally into the space between your side and straight ahead. Lift your hand to shoulder height or as far as you can, without increasing any shoulder pain. At first, many people do not lift their hands above shoulder height.  Avoid shrugging your right / left shoulder as your arm rises, by keeping your shoulder blade tucked down and toward your mid-back spine.  Hold for __________ seconds. Control the descent of your hand, as you slowly return to your starting position. Repeat __________ times. Complete this exercise __________ times per day.  STRENGTH - External Rotators  Secure a rubber exercise band or tubing to a fixed object (table, pole) so that it is at the  same height as your right / left elbow when you are standing or sitting on a firm surface.  Stand or sit so that the secured exercise band is at your uninjured side.  Bend your right / left elbow 90 degrees. Place a folded towel or small pillow under your right / left arm, so that your elbow is a few inches away from your side.  Keeping the tension on the exercise band, pull it away from your body, as if pivoting on your elbow. Be sure to keep your body steady, so that the movement is coming only from your rotating shoulder.  Hold for __________ seconds. Release the tension in a controlled manner, as you return to the starting position. Repeat __________ times. Complete this exercise __________ times per day.  STRENGTH - Internal Rotators   Secure a rubber exercise band or tubing to a  fixed object (table, pole) so that it is at the same height as your right / left elbow when you are standing or sitting on a firm surface.  Stand or sit so that the secured exercise band is at your right / left side.  Bend your elbow 90 degrees. Place a folded towel or small pillow under your right / left arm so that your elbow is a few inches away from your side.  Keeping the tension on the exercise band, pull it across your body, toward your stomach. Be sure to keep your body steady, so that the movement is coming only from your rotating shoulder.  Hold for __________ seconds. Release the tension in a controlled manner, as you return to the starting position. Repeat __________ times. Complete this exercise __________ times per day.  STRENGTH - Scapular Protractors, Standing   Stand arms length away from a wall. Place your hands on the wall, keeping your elbows straight.  Begin by dropping your shoulder blades down and toward your mid-back spine.  To strengthen your protractors, keep your shoulder blades down, but slide them forward on your rib cage. It will feel as if you are lifting the back of your rib cage away from the wall. This is a subtle motion and can be challenging to complete. Ask your caregiver for further instruction, if you are not sure you are doing the exercise correctly.  Hold for __________ seconds. Slowly return to the starting position, resting the muscles completely before starting the next repetition. Repeat __________ times. Complete this exercise __________ times per day. STRENGTH - Scapular Protractors, Supine  Lie on your back on a firm surface. Extend your right / left arm straight into the air while holding a __________ weight in your hand.  Keeping your head and back in place, lift your shoulder off the floor.  Hold for __________ seconds. Slowly return to the starting position, and allow your muscles to relax completely before starting the next  repetition. Repeat __________ times. Complete this exercise __________ times per day. STRENGTH - Scapular Protractors, Quadruped  Get onto your hands and knees, with your shoulders directly over your hands (or as close as you can be, comfortably).  Keeping your elbows locked, lift the back of your rib cage up into your shoulder blades, so your mid-back rounds out. Keep your neck muscles relaxed.  Hold this position for __________ seconds. Slowly return to the starting position and allow your muscles to relax completely before starting the next repetition. Repeat __________ times. Complete this exercise __________ times per day.  STRENGTH - Scapular Retractors  Secure a rubber  exercise band or tubing to a fixed object (table, pole), so that it is at the height of your shoulders when you are either standing, or sitting on a firm armless chair.  With a palm down grip, grasp an end of the band in each hand. Straighten your elbows and lift your hands straight in front of you, at shoulder height. Step back, away from the secured end of the band, until it becomes tense.  Squeezing your shoulder blades together, draw your elbows back toward your sides, as you bend them. Keep your upper arms lifted away from your body throughout the exercise.  Hold for __________ seconds. Slowly ease the tension on the band, as you reverse the directions and return to the starting position. Repeat __________ times. Complete this exercise __________ times per day. STRENGTH - Shoulder Extensors   Secure a rubber exercise band or tubing to a fixed object (table, pole) so that it is at the height of your shoulders when you are either standing, or sitting on a firm armless chair.  With a thumbs-up grip, grasp an end of the band in each hand. Straighten your elbows and lift your hands straight in front of you, at shoulder height. Step back, away from the secured end of the band, until it becomes tense.  Squeezing your  shoulder blades together, pull your hands down to the sides of your thighs. Do not allow your hands to go behind you.  Hold for __________ seconds. Slowly ease the tension on the band, as you reverse the directions and return to the starting position. Repeat __________ times. Complete this exercise __________ times per day.  STRENGTH - Scapular Retractors and External Rotators   Secure a rubber exercise band or tubing to a fixed object (table, pole) so that it is at the height as your shoulders, when you are either standing, or sitting on a firm armless chair.  With a palm down grip, grasp an end of the band in each hand. Bend your elbows 90 degrees and lift your elbows to shoulder height, at your sides. Step back, away from the secured end of the band, until it becomes tense.  Squeezing your shoulder blades together, rotate your shoulders so that your upper arms and elbows remain stationary, but your fists travel upward to head height.  Hold for __________ seconds. Slowly ease the tension on the band, as you reverse the directions and return to the starting position. Repeat __________ times. Complete this exercise __________ times per day.  STRENGTH - Scapular Retractors and External Rotators, Rowing   Secure a rubber exercise band or tubing to a fixed object (table, pole) so that it is at the height of your shoulders, when you are either standing, or sitting on a firm armless chair.  With a palm down grip, grasp an end of the band in each hand. Straighten your elbows and lift your hands straight in front of you, at shoulder height. Step back, away from the secured end of the band, until it becomes tense.  Step 1: Squeeze your shoulder blades together. Bending your elbows, draw your hands to your chest, as if you are rowing a boat. At the end of this motion, your hands and elbow should be at shoulder height and your elbows should be out to your sides.  Step 2: Rotate your shoulders, to raise  your hands above your head. Your forearms should be vertical and your upper arms should be horizontal.  Hold for __________ seconds. Slowly ease  the tension on the band, as you reverse the directions and return to the starting position. Repeat __________ times. Complete this exercise __________ times per day.  STRENGTH - Scapular Depressors  Find a sturdy chair without wheels, such as a dining room chair.  Keeping your feet on the floor, and your hands on the chair arms, lift your bottom up from the seat, and lock your elbows.  Keeping your elbows straight, allow gravity to pull your body weight down. Your shoulders will rise toward your ears.  Raise your body against gravity by drawing your shoulder blades down your back, shortening the distance between your shoulders and ears. Although your feet should always maintain contact with the floor, your feet should progressively support less body weight, as you get stronger.  Hold for __________ seconds. In a controlled and slow manner, lower your body weight to begin the next repetition. Repeat __________ times. Complete this exercise __________ times per day.  Document Released: 04/12/2005 Document Revised: 07/05/2011 Document Reviewed: 07/25/2008 [facility] Patient Information 2015 [place], [place]. This information is not intended to replace advice given to you by your health care provider. Make sure you discuss any questions you have with your health care provider.     I personally performed the services described in this documentation, which was scribed in my presence. The recorded information has been reviewed and considered, and addended by me as needed.

## 2014-08-05 NOTE — Patient Instructions (Signed)
I will talk with ortho to determine if back specialist would be next step in evaluation of the shift in your pelvis.  Try alleve for shoulder pain, exercises below and if not improving in next 3-4 weeks - consider xray and possible injection.   Let me know if my help is needed in scheduling the new wheelchair fitting.   Return to the clinic or go to the nearest emergency room if any of your symptoms worsen or new symptoms occur.  Rotator Cuff Tendinitis  Rotator cuff tendinitis is inflammation of the tough, cord-like bands that connect muscle to bone (tendons) in your rotator cuff. Your rotator cuff is the collection of all the muscles and tendons that connect your arm to your shoulder. Your rotator cuff holds the head of your upper arm bone (humerus) in the cup (fossa) of your shoulder blade (scapula). CAUSES Rotator cuff tendinitis is usually caused by overusing the joint involved.  SIGNS AND SYMPTOMS  Deep ache in the shoulder also felt on the outside upper arm over the shoulder muscle.  Point tenderness over the area that is injured.  Pain comes on gradually and becomes worse with lifting the arm to the side (abduction) or turning it inward (internal rotation).  May lead to a chronic tear: When a rotator cuff tendon becomes inflamed, it runs the risk of losing its blood supply, causing some tendon fibers to die. This increases the risk that the tendon can fray and partially or completely tear. DIAGNOSIS Rotator cuff tendinitis is diagnosed by taking a medical history, performing a physical exam, and reviewing results of imaging exams. The medical history is useful to help determine the type of rotator cuff injury. The physical exam will include looking at the injured shoulder, feeling the injured area, and watching you do range-of-motion exercises. X-ray exams are typically done to rule out other causes of shoulder pain, such as fractures. MRI is the imaging exam usually used for  significant shoulder injuries. Sometimes a dye study called CT arthrogram is done, but it is not as widely used as MRI. In some institutions, special ultrasound tests may also be used to aid in the diagnosis. TREATMENT  Less Severe Cases  Use of a sling to rest the shoulder for a short period of time. Prolonged use of the sling can cause stiffness, weakness, and loss of motion of the shoulder joint.  Anti-inflammatory medicines, such as ibuprofen or naproxen sodium, may be prescribed. More Severe Cases  Physical therapy.  Use of steroid injections into the shoulder joint.  Surgery. HOME CARE INSTRUCTIONS   Use a sling or splint until the pain decreases. Prolonged use of the sling can cause stiffness, weakness, and loss of motion of the shoulder joint.  Apply ice to the injured area:  Put ice in a plastic bag.  Place a towel between your skin and the bag.  Leave the ice on for 20 minutes, 2-3 times a day.  Try to avoid use other than gentle range of motion while your shoulder is painful. Use the shoulder and exercise only as directed by your health care provider. Stop exercises or range of motion if pain or discomfort increases, unless directed otherwise by your health care provider.  Only take over-the-counter or prescription medicines for pain, discomfort, or fever as directed by your health care provider.  If you were given a shoulder sling and straps (immobilizer), do not remove it except as directed, or until you see a health care provider for a follow-up  exam. If you need to remove it, move your arm as little as possible or as directed.  You may want to sleep on several pillows at night to lessen swelling and pain. SEEK IMMEDIATE MEDICAL CARE IF:   Your shoulder pain increases or new pain develops in your arm, hand, or fingers and is not relieved with medicines.  You have new, unexplained symptoms, especially increased numbness in the hands or loss of strength.  You  develop any worsening of the problems that brought you in for care.  Your arm, hand, or fingers are numb or tingling.  Your arm, hand, or fingers are swollen, painful, or turn white or blue. MAKE SURE YOU:  Understand these instructions.  Will watch your condition.  Will get help right away if you are not doing well or get worse. Document Released: 07/03/2003 Document Revised: 01/31/2013 Document Reviewed: 11/22/2012 [facility] Patient Information 2015 [place], [place]. This information is not intended to replace advice given to you by your health care provider. Make sure you discuss any questions you have with your health care provider.  Impingement Syndrome, Rotator Cuff, Bursitis with Rehab Impingement syndrome is a condition that involves inflammation of the tendons of the rotator cuff and the subacromial bursa, that causes pain in the shoulder. The rotator cuff consists of four tendons and muscles that control much of the shoulder and upper arm function. The subacromial bursa is a fluid filled sac that helps reduce friction between the rotator cuff and one of the bones of the shoulder (acromion). Impingement syndrome is usually an overuse injury that causes swelling of the bursa (bursitis), swelling of the tendon (tendonitis), and/or a tear of the tendon (strain). Strains are classified into three categories. Grade 1 strains cause pain, but the tendon is not lengthened. Grade 2 strains include a lengthened ligament, due to the ligament being stretched or partially ruptured. With grade 2 strains there is still function, although the function may be decreased. Grade 3 strains include a complete tear of the tendon or muscle, and function is usually impaired. SYMPTOMS   Pain around the shoulder, often at the outer portion of the upper arm.  Pain that gets worse with shoulder function, especially when reaching overhead or lifting.  Sometimes, aching when not using the arm.  Pain that wakes you  up at night.  Sometimes, tenderness, swelling, warmth, or redness over the affected area.  Loss of strength.  Limited motion of the shoulder, especially reaching behind the back (to the back pocket or to unhook bra) or across your body.  Crackling sound (crepitation) when moving the arm.  Biceps tendon pain and inflammation (in the front of the shoulder). Worse when bending the elbow or lifting. CAUSES  Impingement syndrome is often an overuse injury, in which chronic (repetitive) motions cause the tendons or bursa to become inflamed. A strain occurs when a force is paced on the tendon or muscle that is greater than it can withstand. Common mechanisms of injury include: Stress from sudden increase in duration, frequency, or intensity of training.  Direct hit (trauma) to the shoulder.  Aging, erosion of the tendon with normal use.  Bony bump on shoulder (acromial spur). RISK INCREASES WITH:  Contact sports (football, wrestling, boxing).  Throwing sports (baseball, tennis, volleyball).  Weightlifting and bodybuilding.  Heavy labor.  Previous injury to the rotator cuff, including impingement.  Poor shoulder strength and flexibility.  Failure to warm up properly before activity.  Inadequate protective equipment.  Old age.  Bony  bump on shoulder (acromial spur). PREVENTION   Warm up and stretch properly before activity.  Allow for adequate recovery between workouts.  Maintain physical fitness:  Strength, flexibility, and endurance.  Cardiovascular fitness.  Learn and use proper exercise technique. PROGNOSIS  If treated properly, impingement syndrome usually goes away within 6 weeks. Sometimes surgery is required.  RELATED COMPLICATIONS   Longer healing time if not properly treated, or if not given enough time to heal.  Recurring symptoms, that result in a chronic condition.  Shoulder stiffness, frozen shoulder, or loss of motion.  Rotator cuff tendon  tear.  Recurring symptoms, especially if activity is resumed too soon, with overuse, with a direct blow, or when using poor technique. TREATMENT  Treatment first involves the use of ice and medicine, to reduce pain and inflammation. The use of strengthening and stretching exercises may help reduce pain with activity. These exercises may be performed at home or with a therapist. If non-surgical treatment is unsuccessful after more than 6 months, surgery may be advised. After surgery and rehabilitation, activity is usually possible in 3 months.  MEDICATION  If pain medicine is needed, nonsteroidal anti-inflammatory medicines (aspirin and ibuprofen), or other minor pain relievers (acetaminophen), are often advised.  Do not take pain medicine for 7 days before surgery.  Prescription pain relievers may be given, if your caregiver thinks they are needed. Use only as directed and only as much as you need.  Corticosteroid injections may be given by your caregiver. These injections should be reserved for the most serious cases, because they may only be given a certain number of times. HEAT AND COLD  Cold treatment (icing) should be applied for 10 to 15 minutes every 2 to 3 hours for inflammation and pain, and immediately after activity that aggravates your symptoms. Use ice packs or an ice massage.  Heat treatment may be used before performing stretching and strengthening activities prescribed by your caregiver, physical therapist, or athletic trainer. Use a heat pack or a warm water soak. SEEK MEDICAL CARE IF:   Symptoms get worse or do not improve in 4 to 6 weeks, despite treatment.  New, unexplained symptoms develop. (Drugs used in treatment may produce side effects.) EXERCISES  RANGE OF MOTION (ROM) AND STRETCHING EXERCISES - Impingement Syndrome (Rotator Cuff  Tendinitis, Bursitis) These exercises may help you when beginning to rehabilitate your injury. Your symptoms may go away with or  without further involvement from your physician, physical therapist or athletic trainer. While completing these exercises, remember:   Restoring tissue flexibility helps normal motion to return to the joints. This allows healthier, less painful movement and activity.  An effective stretch should be held for at least 30 seconds.  A stretch should never be painful. You should only feel a gentle lengthening or release in the stretched tissue. STRETCH - Flexion, Standing  Stand with good posture. With an underhand grip on your right / left hand, and an overhand grip on the opposite hand, grasp a broomstick or cane so that your hands are a little more than shoulder width apart.  Keeping your right / left elbow straight and shoulder muscles relaxed, push the stick with your opposite hand, to raise your right / left arm in front of your body and then overhead. Raise your arm until you feel a stretch in your right / left shoulder, but before you have increased shoulder pain.  Try to avoid shrugging your right / left shoulder as your arm rises, by keeping your  shoulder blade tucked down and toward your mid-back spine. Hold for __________ seconds.  Slowly return to the starting position. Repeat __________ times. Complete this exercise __________ times per day. STRETCH - Abduction, Supine  Lie on your back. With an underhand grip on your right / left hand and an overhand grip on the opposite hand, grasp a broomstick or cane so that your hands are a little more than shoulder width apart.  Keeping your right / left elbow straight and your shoulder muscles relaxed, push the stick with your opposite hand, to raise your right / left arm out to the side of your body and then overhead. Raise your arm until you feel a stretch in your right / left shoulder, but before you have increased shoulder pain.  Try to avoid shrugging your right / left shoulder as your arm rises, by keeping your shoulder blade tucked down  and toward your mid-back spine. Hold for __________ seconds.  Slowly return to the starting position. Repeat __________ times. Complete this exercise __________ times per day. ROM - Flexion, Active-Assisted  Lie on your back. You may bend your knees for comfort.  Grasp a broomstick or cane so your hands are about shoulder width apart. Your right / left hand should grip the end of the stick, so that your hand is positioned "thumbs-up," as if you were about to shake hands.  Using your healthy arm to lead, raise your right / left arm overhead, until you feel a gentle stretch in your shoulder. Hold for __________ seconds.  Use the stick to assist in returning your right / left arm to its starting position. Repeat __________ times. Complete this exercise __________ times per day.  ROM - Internal Rotation, Supine   Lie on your back on a firm surface. Place your right / left elbow about 60 degrees away from your side. Elevate your elbow with a folded towel, so that the elbow and shoulder are the same height.  Using a broomstick or cane and your strong arm, pull your right / left hand toward your body until you feel a gentle stretch, but no increase in your shoulder pain. Keep your shoulder and elbow in place throughout the exercise.  Hold for __________ seconds. Slowly return to the starting position. Repeat __________ times. Complete this exercise __________ times per day. STRETCH - Internal Rotation  Place your right / left hand behind your back, palm up.  Throw a towel or belt over your opposite shoulder. Grasp the towel with your right / left hand.  While keeping an upright posture, gently pull up on the towel, until you feel a stretch in the front of your right / left shoulder.  Avoid shrugging your right / left shoulder as your arm rises, by keeping your shoulder blade tucked down and toward your mid-back spine.  Hold for __________ seconds. Release the stretch, by lowering your  healthy hand. Repeat __________ times. Complete this exercise __________ times per day. ROM - Internal Rotation   Using an underhand grip, grasp a stick behind your back with both hands.  While standing upright with good posture, slide the stick up your back until you feel a mild stretch in the front of your shoulder.  Hold for __________ seconds. Slowly return to your starting position. Repeat __________ times. Complete this exercise __________ times per day.  STRETCH - Posterior Shoulder Capsule   Stand or sit with good posture. Grasp your right / left elbow and draw it across your chest,  keeping it at the same height as your shoulder.  Pull your elbow, so your upper arm comes in closer to your chest. Pull until you feel a gentle stretch in the back of your shoulder.  Hold for __________ seconds. Repeat __________ times. Complete this exercise __________ times per day. STRENGTHENING EXERCISES - Impingement Syndrome (Rotator Cuff Tendinitis, Bursitis) These exercises may help you when beginning to rehabilitate your injury. They may resolve your symptoms with or without further involvement from your physician, physical therapist or athletic trainer. While completing these exercises, remember:  Muscles can gain both the endurance and the strength needed for everyday activities through controlled exercises.  Complete these exercises as instructed by your physician, physical therapist or athletic trainer. Increase the resistance and repetitions only as guided.  You may experience muscle soreness or fatigue, but the pain or discomfort you are trying to eliminate should never worsen during these exercises. If this pain does get worse, stop and make sure you are following the directions exactly. If the pain is still present after adjustments, discontinue the exercise until you can discuss the trouble with your clinician.  During your recovery, avoid activity or exercises which involve actions  that place your injured hand or elbow above your head or behind your back or head. These positions stress the tissues which you are trying to heal. STRENGTH - Scapular Depression and Adduction   With good posture, sit on a firm chair. Support your arms in front of you, with pillows, arm rests, or on a table top. Have your elbows in line with the sides of your body.  Gently draw your shoulder blades down and toward your mid-back spine. Gradually increase the tension, without tensing the muscles along the top of your shoulders and the back of your neck.  Hold for __________ seconds. Slowly release the tension and relax your muscles completely before starting the next repetition.  After you have practiced this exercise, remove the arm support and complete the exercise in standing as well as sitting position. Repeat __________ times. Complete this exercise __________ times per day.  STRENGTH - Shoulder Abductors, Isometric  With good posture, stand or sit about 4-6 inches from a wall, with your right / left side facing the wall.  Bend your right / left elbow. Gently press your right / left elbow into the wall. Increase the pressure gradually, until you are pressing as hard as you can, without shrugging your shoulder or increasing any shoulder discomfort.  Hold for __________ seconds.  Release the tension slowly. Relax your shoulder muscles completely before you begin the next repetition. Repeat __________ times. Complete this exercise __________ times per day.  STRENGTH - External Rotators, Isometric  Keep your right / left elbow at your side and bend it 90 degrees.  Step into a door frame so that the outside of your right / left wrist can press against the door frame without your upper arm leaving your side.  Gently press your right / left wrist into the door frame, as if you were trying to swing the back of your hand away from your stomach. Gradually increase the tension, until you are  pressing as hard as you can, without shrugging your shoulder or increasing any shoulder discomfort.  Hold for __________ seconds.  Release the tension slowly. Relax your shoulder muscles completely before you begin the next repetition. Repeat __________ times. Complete this exercise __________ times per day.  STRENGTH - Supraspinatus   Stand or sit with good posture.  Grasp a __________ weight, or an exercise band or tubing, so that your hand is "thumbs-up," like you are shaking hands.  Slowly lift your right / left arm in a "V" away from your thigh, diagonally into the space between your side and straight ahead. Lift your hand to shoulder height or as far as you can, without increasing any shoulder pain. At first, many people do not lift their hands above shoulder height.  Avoid shrugging your right / left shoulder as your arm rises, by keeping your shoulder blade tucked down and toward your mid-back spine.  Hold for __________ seconds. Control the descent of your hand, as you slowly return to your starting position. Repeat __________ times. Complete this exercise __________ times per day.  STRENGTH - External Rotators  Secure a rubber exercise band or tubing to a fixed object (table, pole) so that it is at the same height as your right / left elbow when you are standing or sitting on a firm surface.  Stand or sit so that the secured exercise band is at your uninjured side.  Bend your right / left elbow 90 degrees. Place a folded towel or small pillow under your right / left arm, so that your elbow is a few inches away from your side.  Keeping the tension on the exercise band, pull it away from your body, as if pivoting on your elbow. Be sure to keep your body steady, so that the movement is coming only from your rotating shoulder.  Hold for __________ seconds. Release the tension in a controlled manner, as you return to the starting position. Repeat __________ times. Complete this  exercise __________ times per day.  STRENGTH - Internal Rotators   Secure a rubber exercise band or tubing to a fixed object (table, pole) so that it is at the same height as your right / left elbow when you are standing or sitting on a firm surface.  Stand or sit so that the secured exercise band is at your right / left side.  Bend your elbow 90 degrees. Place a folded towel or small pillow under your right / left arm so that your elbow is a few inches away from your side.  Keeping the tension on the exercise band, pull it across your body, toward your stomach. Be sure to keep your body steady, so that the movement is coming only from your rotating shoulder.  Hold for __________ seconds. Release the tension in a controlled manner, as you return to the starting position. Repeat __________ times. Complete this exercise __________ times per day.  STRENGTH - Scapular Protractors, Standing   Stand arms length away from a wall. Place your hands on the wall, keeping your elbows straight.  Begin by dropping your shoulder blades down and toward your mid-back spine.  To strengthen your protractors, keep your shoulder blades down, but slide them forward on your rib cage. It will feel as if you are lifting the back of your rib cage away from the wall. This is a subtle motion and can be challenging to complete. Ask your caregiver for further instruction, if you are not sure you are doing the exercise correctly.  Hold for __________ seconds. Slowly return to the starting position, resting the muscles completely before starting the next repetition. Repeat __________ times. Complete this exercise __________ times per day. STRENGTH - Scapular Protractors, Supine  Lie on your back on a firm surface. Extend your right / left arm straight into the air while  holding a __________ weight in your hand.  Keeping your head and back in place, lift your shoulder off the floor.  Hold for __________ seconds. Slowly  return to the starting position, and allow your muscles to relax completely before starting the next repetition. Repeat __________ times. Complete this exercise __________ times per day. STRENGTH - Scapular Protractors, Quadruped  Get onto your hands and knees, with your shoulders directly over your hands (or as close as you can be, comfortably).  Keeping your elbows locked, lift the back of your rib cage up into your shoulder blades, so your mid-back rounds out. Keep your neck muscles relaxed.  Hold this position for __________ seconds. Slowly return to the starting position and allow your muscles to relax completely before starting the next repetition. Repeat __________ times. Complete this exercise __________ times per day.  STRENGTH - Scapular Retractors  Secure a rubber exercise band or tubing to a fixed object (table, pole), so that it is at the height of your shoulders when you are either standing, or sitting on a firm armless chair.  With a palm down grip, grasp an end of the band in each hand. Straighten your elbows and lift your hands straight in front of you, at shoulder height. Step back, away from the secured end of the band, until it becomes tense.  Squeezing your shoulder blades together, draw your elbows back toward your sides, as you bend them. Keep your upper arms lifted away from your body throughout the exercise.  Hold for __________ seconds. Slowly ease the tension on the band, as you reverse the directions and return to the starting position. Repeat __________ times. Complete this exercise __________ times per day. STRENGTH - Shoulder Extensors   Secure a rubber exercise band or tubing to a fixed object (table, pole) so that it is at the height of your shoulders when you are either standing, or sitting on a firm armless chair.  With a thumbs-up grip, grasp an end of the band in each hand. Straighten your elbows and lift your hands straight in front of you, at shoulder  height. Step back, away from the secured end of the band, until it becomes tense.  Squeezing your shoulder blades together, pull your hands down to the sides of your thighs. Do not allow your hands to go behind you.  Hold for __________ seconds. Slowly ease the tension on the band, as you reverse the directions and return to the starting position. Repeat __________ times. Complete this exercise __________ times per day.  STRENGTH - Scapular Retractors and External Rotators   Secure a rubber exercise band or tubing to a fixed object (table, pole) so that it is at the height as your shoulders, when you are either standing, or sitting on a firm armless chair.  With a palm down grip, grasp an end of the band in each hand. Bend your elbows 90 degrees and lift your elbows to shoulder height, at your sides. Step back, away from the secured end of the band, until it becomes tense.  Squeezing your shoulder blades together, rotate your shoulders so that your upper arms and elbows remain stationary, but your fists travel upward to head height.  Hold for __________ seconds. Slowly ease the tension on the band, as you reverse the directions and return to the starting position. Repeat __________ times. Complete this exercise __________ times per day.  STRENGTH - Scapular Retractors and External Rotators, Rowing   Secure a rubber exercise band or tubing  to a fixed object (table, pole) so that it is at the height of your shoulders, when you are either standing, or sitting on a firm armless chair.  With a palm down grip, grasp an end of the band in each hand. Straighten your elbows and lift your hands straight in front of you, at shoulder height. Step back, away from the secured end of the band, until it becomes tense.  Step 1: Squeeze your shoulder blades together. Bending your elbows, draw your hands to your chest, as if you are rowing a boat. At the end of this motion, your hands and elbow should be at  shoulder height and your elbows should be out to your sides.  Step 2: Rotate your shoulders, to raise your hands above your head. Your forearms should be vertical and your upper arms should be horizontal.  Hold for __________ seconds. Slowly ease the tension on the band, as you reverse the directions and return to the starting position. Repeat __________ times. Complete this exercise __________ times per day.  STRENGTH - Scapular Depressors  Find a sturdy chair without wheels, such as a dining room chair.  Keeping your feet on the floor, and your hands on the chair arms, lift your bottom up from the seat, and lock your elbows.  Keeping your elbows straight, allow gravity to pull your body weight down. Your shoulders will rise toward your ears.  Raise your body against gravity by drawing your shoulder blades down your back, shortening the distance between your shoulders and ears. Although your feet should always maintain contact with the floor, your feet should progressively support less body weight, as you get stronger.  Hold for __________ seconds. In a controlled and slow manner, lower your body weight to begin the next repetition. Repeat __________ times. Complete this exercise __________ times per day.  Document Released: 04/12/2005 Document Revised: 07/05/2011 Document Reviewed: 07/25/2008 [facility] Patient Information 2015 [place], [place]. This information is not intended to replace advice given to you by your health care provider. Make sure you discuss any questions you have with your health care provider.

## 2014-09-09 ENCOUNTER — Ambulatory Visit (INDEPENDENT_AMBULATORY_CARE_PROVIDER_SITE_OTHER): Payer: 59 | Admitting: Family Medicine

## 2014-09-09 ENCOUNTER — Encounter: Payer: Self-pay | Admitting: Physician Assistant

## 2014-09-09 VITALS — BP 108/69 | HR 87 | Temp 99.1°F | Resp 18

## 2014-09-09 DIAGNOSIS — R8299 Other abnormal findings in urine: Secondary | ICD-10-CM

## 2014-09-09 DIAGNOSIS — G839 Paralytic syndrome, unspecified: Secondary | ICD-10-CM | POA: Diagnosis not present

## 2014-09-09 DIAGNOSIS — Z4689 Encounter for fitting and adjustment of other specified devices: Secondary | ICD-10-CM

## 2014-09-09 DIAGNOSIS — R8290 Unspecified abnormal findings in urine: Secondary | ICD-10-CM

## 2014-09-09 DIAGNOSIS — G8220 Paraplegia, unspecified: Secondary | ICD-10-CM

## 2014-09-09 DIAGNOSIS — Z993 Dependence on wheelchair: Secondary | ICD-10-CM | POA: Diagnosis not present

## 2014-09-09 LAB — POCT UA - MICROSCOPIC ONLY
Bacteria, U Microscopic: NEGATIVE
Casts, Ur, LPF, POC: NEGATIVE
Crystals, Ur, HPF, POC: NEGATIVE
EPITHELIAL CELLS, URINE PER MICROSCOPY: NEGATIVE
MUCUS UA: NEGATIVE

## 2014-09-09 LAB — POCT URINALYSIS DIPSTICK
BILIRUBIN UA: NEGATIVE
Glucose, UA: NEGATIVE
Ketones, UA: NEGATIVE
LEUKOCYTES UA: NEGATIVE
Nitrite, UA: NEGATIVE
Protein, UA: NEGATIVE
RBC UA: NEGATIVE
Spec Grav, UA: 1.01
Urobilinogen, UA: 0.2
pH, UA: 6.5

## 2014-09-09 NOTE — Progress Notes (Signed)
Subjective:    Patient ID: Charles Hansen, male    DOB: 03/07/1966, 49 y.o.   MRN: [id number]  HPI Patient presents for prescription for wheelchair and possible UTI. Has been a paraplegic and in need of wheelchair since 1991 when he fell from two story building and burst T9 vertebrae. Has had current wheelchair and has had to fix multiple time. Ultra lightweight manual wheelchair requested.   Thinks he may have UTI since yesterday since he noticed that his urine is darker and saw some sediment. He self-caths himself, but doesn't have any feeling so can't qualify dysuria. Denies fever or N/V.   Review of Systems  Constitutional: Negative for fever.  Musculoskeletal: Positive for myalgias and arthralgias. Negative for joint swelling.  Neurological: Positive for numbness.       Objective:   Physical Exam  Constitutional: He is oriented to person, place, and time. He appears well-developed and well-nourished. No distress.  Blood pressure 108/69, pulse 87, temperature 99.1 F (37.3 C), temperature source Oral, resp. rate 18, SpO2 95 %.  HENT:  Head: Normocephalic and atraumatic.  Right Ear: External ear normal.  Left Ear: External ear normal.  Eyes: Conjunctivae and EOM are normal. Pupils are equal, round, and reactive to light. Right eye exhibits no discharge. Left eye exhibits no discharge. No scleral icterus.  Cardiovascular: Normal rate, regular rhythm and normal heart sounds.  Exam reveals no gallop and no friction rub.   No murmur heard. Pulmonary/Chest: Effort normal. No respiratory distress. He has no wheezes. He has no rales.  Abdominal: Soft. Bowel sounds are normal.  Musculoskeletal: He exhibits no edema or tenderness.  Neurological: He is alert and oriented to person, place, and time. He displays abnormal reflex. A cranial nerve deficit is present. He exhibits abnormal muscle tone.  Skin: Skin is warm and dry. No rash noted. He is not diaphoretic. No erythema. No pallor.     Results for orders placed or performed in visit on 09/09/14  POCT UA - Microscopic Only  Result Value Ref Range   WBC, Ur, HPF, POC 0-1    RBC, urine, microscopic 0-1    Bacteria, U Microscopic Negative    Mucus, UA Negative    Epithelial cells, urine per micros Negative    Crystals, Ur, HPF, POC Negative    Casts, Ur, LPF, POC Negative    Yeast, UA    POCT urinalysis dipstick  Result Value Ref Range   Color, UA amber    Clarity, UA Clear    Glucose, UA Negative    Bilirubin, UA negative    Ketones, UA Negative    Spec Grav, UA 1.010    Blood, UA Negative    pH, UA 6.5    Protein, UA Negative    Urobilinogen, UA 0.2    Nitrite, UA negative    Leukocytes, UA Negative       Assessment & Plan:  1. Fitting and adjustment of wheelchair 2. Wheelchair dependent 3. 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 I have reviewed and concur with PT evaluation. Ultra light-weight manual WC will allow patient better mobility and allow for better access to WC ramps and is easier to break down to put into car for travel. Forms completed and will be [facility] to [facility], [name]. Records release on file.   4. Cloudy urine Increase water intake.  - POCT UA - Microscopic Only - POCT urinalysis dipstick - Urine culture   Charles Hansen  Pasquale Matters PA-C  [facility] 09/09/2014 8:47 PM

## 2014-09-10 LAB — URINE CULTURE
Colony Count: NO GROWTH
ORGANISM ID, BACTERIA: NO GROWTH

## 2014-09-11 ENCOUNTER — Encounter: Payer: Self-pay | Admitting: Physician Assistant

## 2014-09-18 NOTE — Progress Notes (Signed)
Patient discussed with Ms. Mitzi DavenportBrewington. Agree with assessment and plan of care per her note. Reviewed PT assessment for wheelchair and fitting.  Agree with assessment and need for new wheelchair. Paperwork completed by Ms. Brewington after her assessment as below.

## 2014-10-04 ENCOUNTER — Other Ambulatory Visit: Payer: Self-pay | Admitting: Orthopedic Surgery

## 2014-10-04 DIAGNOSIS — M545 Low back pain: Secondary | ICD-10-CM

## 2014-10-16 ENCOUNTER — Other Ambulatory Visit: Payer: Self-pay | Admitting: Orthopedic Surgery

## 2014-10-16 DIAGNOSIS — M545 Low back pain, unspecified: Secondary | ICD-10-CM

## 2014-10-16 DIAGNOSIS — M546 Pain in thoracic spine: Secondary | ICD-10-CM

## 2014-10-22 ENCOUNTER — Ambulatory Visit
Admission: RE | Admit: 2014-10-22 | Discharge: 2014-10-22 | Disposition: A | Discharge status: Home / Self Care | Payer: 59 | Source: Ambulatory Visit | Attending: Orthopedic Surgery | Admitting: Orthopedic Surgery

## 2014-10-22 DIAGNOSIS — M545 Low back pain, unspecified: Secondary | ICD-10-CM

## 2014-10-22 DIAGNOSIS — M546 Pain in thoracic spine: Secondary | ICD-10-CM

## 2014-10-24 ENCOUNTER — Telehealth (HOSPITAL_COMMUNITY): Payer: Self-pay | Admitting: Family Medicine

## 2014-10-24 MED ORDER — CEPHALEXIN 500 MG PO CAPS: 500.0000 mg | ORAL_CAPSULE | Freq: Three times a day (TID) | ORAL | Status: DC

## 2014-10-24 NOTE — ED Notes (Signed)
PT notes UTI sx. Cloudy urine. No f/c. Self caths. Plan tx keflex. F/U pcp  Rodolph BongEvan S Aurelia Gras, MD 10/24/14 [phone]

## 2014-11-18 ENCOUNTER — Telehealth: Payer: Self-pay

## 2014-11-18 NOTE — Telephone Encounter (Signed)
Pt is a paraplegic and operates his car with hand controls. Pt states the car company will reimburse him the cost of putting the hand controls in his car, if he has a script from his doctor. Pt is asking if Dr Neva Seat will write a script for him stating is it medically necessary for him to have hand controls in his car.  Please call pt to advise

## 2014-11-21 NOTE — Telephone Encounter (Signed)
Copied for scanning. Rx in drawer and pt notified on VM.

## 2014-11-21 NOTE — Telephone Encounter (Signed)
Paper Rx written. Placed in nurses box - can scan into chart then ok for pickup.  Let me know if other info needed.

## 2015-02-20 ENCOUNTER — Ambulatory Visit (INDEPENDENT_AMBULATORY_CARE_PROVIDER_SITE_OTHER): Payer: 59 | Admitting: Family Medicine

## 2015-02-20 VITALS — BP 124/72 | HR 62 | Temp 98.6°F | Resp 18

## 2015-02-20 DIAGNOSIS — R8290 Unspecified abnormal findings in urine: Secondary | ICD-10-CM | POA: Diagnosis not present

## 2015-02-20 DIAGNOSIS — R509 Fever, unspecified: Secondary | ICD-10-CM | POA: Diagnosis not present

## 2015-02-20 DIAGNOSIS — T83511A Infection and inflammatory reaction due to indwelling urethral catheter, initial encounter: Secondary | ICD-10-CM

## 2015-02-20 DIAGNOSIS — N390 Urinary tract infection, site not specified: Secondary | ICD-10-CM

## 2015-02-20 LAB — POCT CBC
Granulocyte percent: 64.2 %G (ref 37–80)
HEMATOCRIT: 43.3 % — AB (ref 43.5–53.7)
Hemoglobin: 15.3 g/dL (ref 14.1–18.1)
Lymph, poc: 1.9 (ref 0.6–3.4)
MCH: 34.9 pg — AB (ref 27–31.2)
MCHC: 35.3 g/dL (ref 31.8–35.4)
MCV: 98.6 fL — AB (ref 80–97)
MID (cbc): 0.8 (ref 0–0.9)
MPV: 6.8 fL (ref 0–99.8)
POC GRANULOCYTE: 4.9 (ref 2–6.9)
POC LYMPH PERCENT: 25 %L (ref 10–50)
POC MID %: 10.8 %M (ref 0–12)
Platelet Count, POC: 233 10*3/uL (ref 142–424)
RBC: 4.39 M/uL — AB (ref 4.69–6.13)
RDW, POC: 13 %
WBC: 7.6 10*3/uL (ref 4.6–10.2)

## 2015-02-20 LAB — BASIC METABOLIC PANEL
BUN: 8 mg/dL (ref 7–25)
CO2: 28 mmol/L (ref 20–31)
Calcium: 9.4 mg/dL (ref 8.6–10.3)
Chloride: 103 mmol/L (ref 98–110)
Creat: 0.6 mg/dL (ref 0.60–1.35)
Glucose, Bld: 92 mg/dL (ref 65–99)
POTASSIUM: 4.4 mmol/L (ref 3.5–5.3)
Sodium: 142 mmol/L (ref 135–146)

## 2015-02-20 LAB — POC MICROSCOPIC URINALYSIS (UMFC): MUCUS RE: ABSENT

## 2015-02-20 LAB — POCT URINALYSIS DIP (MANUAL ENTRY)
Bilirubin, UA: NEGATIVE
Glucose, UA: NEGATIVE
Ketones, POC UA: NEGATIVE
NITRITE UA: POSITIVE — AB
PH UA: 7
PROTEIN UA: NEGATIVE
Spec Grav, UA: 1.01
UROBILINOGEN UA: 0.2

## 2015-02-20 MED ORDER — LEVOFLOXACIN 500 MG PO TABS: 500.0000 mg | ORAL_TABLET | Freq: Every day | ORAL | Status: DC

## 2015-02-20 NOTE — Patient Instructions (Signed)
Start Levaquin for suspected urinary infection. Return to the clinic or go to the nearest emergency room if any of your symptoms worsen or new symptoms occur. Flu shot in 1 week (after current symptoms improve.  You should receive a call or letter about your lab results within the next week to 10 days.   I will refer you to urologist for ongoing care and to determine if other testing needed for sediment in urine.

## 2015-02-20 NOTE — Progress Notes (Addendum)
Subjective:  This chart was scribed for Charles Staggers, MD by [name], Medical Scribe. This patient was seen in Room 10 and the patient's care was started at 11:54 AM.   Patient ID: Charles Hansen, male    DOB: March 10, 1966, 49 y.o.   MRN: [id number]  Chief Complaint  Patient presents with  . Fever    yesterday 101  . Flu Vaccine    HPI HPI Comments: [name] is a 49 y.o. male who presents to [facility] complaining of fever, onset yesterday.  Pt ha a PMHx of T9 paraplegia resulting in need for self-catheterization of urine (6 times a day per patient), hx of UTI in the past due to this. Most recent positive urine culture in 10/2013 with citrobacter. Prior to that klebsiella.   Today, pt notes that his symptoms started with chills mid-day yesterday, the symptom became more severe later during the day as he was shivering. Pt then started developing fever around 7 pm last night, Tmax 101. Pt states that he used Naproxen and acetaminophen, however he was still experiencing fever before going to bed. He indicates that he is still experiencing mild chills today, however no fever. Pt denies cough, sore throat, rhinorrhea or congestion. Pt reports that he did not experience side effects with taking Cipro.   Pt is also complaining of intermittent increased sediment in the urine during the past few months. Pt denies increased urinary frequency, rash, lower extremity ulcers, abdominal or back pain. Pt indicates that he does feel full after eating, however he does not experience constipation. Pt states that he has not had a full urological work up done in a few years. He  Indicates that he had one episode of a bladder stone that he reports was large on size.   Patient Active Problem List   Diagnosis Date Noted  . Paraplegia at T9 level (HCC) 10/09/2013  . Closed fracture of medial plateau of left tibia 10/09/2013   Past Medical History  Diagnosis Date  . Paraplegia  [facility])    Past Surgical History  Procedure Laterality Date  . Fracture surgery    . Hernia repair     Allergies  Allergen Reactions  . Bactrim [Sulfamethoxazole-Trimethoprim] Hives and Rash   Prior to Admission medications   Medication Sig Start Date End Date Taking? Authorizing Provider  cephALEXin (KEFLEX) 500 MG capsule Take 1 capsule (500 mg total) by mouth 3 (three) times daily. Patient not taking: Reported on 02/20/2015 10/24/14   Charles Bong, MD  UNABLE TO FIND Hand controls for automobile. Dx code: G29.9 Patient not taking: Reported on 08/05/2014 05/22/14   Shade Flood, MD   Social History   Social History  . Marital Status: Married    Spouse Name: N/A  . Number of Children: N/A  . Years of Education: N/A   Occupational History  . Not on file.   Social History Main Topics  . Smoking status: Never Smoker   . Smokeless tobacco: Not on file  . Alcohol Use: No  . Drug Use: No  . Sexual Activity: Not on file   Other Topics Concern  . Not on file   Social History Narrative    Review of Systems  Constitutional: Positive for fever and chills.  HENT: Negative for congestion, rhinorrhea and sore throat.   Respiratory: Negative for cough.   Gastrointestinal: Negative for abdominal pain and constipation.  Genitourinary: Negative for frequency.  Musculoskeletal: Negative for back pain.  Skin: Negative for rash.      Objective:   Physical Exam  Constitutional: He is oriented to person, place, and time. He appears well-developed and well-nourished. No distress.  HENT:  Head: Normocephalic and atraumatic.  Eyes: EOM are normal. Pupils are equal, round, and reactive to light.  Neck: Neck supple.  Cardiovascular: Normal rate and regular rhythm.  Exam reveals no gallop and no friction rub.   No murmur heard. Pulmonary/Chest: Effort normal and breath sounds normal. No respiratory distress. He has no wheezes. He has no rales.  Abdominal: Soft. He exhibits no  distension.  Neurological: He is alert and oriented to person, place, and time. No cranial nerve deficit.  Skin: Skin is warm and dry.  Psychiatric: He has a normal mood and affect. His behavior is normal.  Nursing note and vitals reviewed.   Filed Vitals:   02/20/15 1059  BP: 124/72  Pulse: 62  Temp: 98.6 F (37 C)  TempSrc: Oral  Resp: 18  SpO2: 99%   Results for orders placed or performed in visit on 02/20/15  POCT Microscopic Urinalysis ([facility])  Result Value Ref Range   WBC,UR,HPF,POC Few (A) None WBC/hpf   RBC,UR,HPF,POC None None RBC/hpf   Bacteria Many (A) None, Too numerous to count   Mucus Absent Absent   Epithelial Cells, UR Per Microscopy None None, Too numerous to count cells/hpf  POCT urinalysis dipstick  Result Value Ref Range   Color, UA yellow yellow   Clarity, UA clear clear   Glucose, UA negative negative   Bilirubin, UA negative negative   Ketones, POC UA negative negative   Spec Grav, UA 1.010    Blood, UA small (A) negative   pH, UA 7.0    Protein Ur, POC negative negative   Urobilinogen, UA 0.2    Nitrite, UA Positive (A) Negative   Leukocytes, UA large (3+) (A) Negative  POCT CBC  Result Value Ref Range   WBC 7.6 4.6 - 10.2 K/uL   Lymph, poc 1.9 0.6 - 3.4   POC LYMPH PERCENT 25.0 10 - 50 %L   MID (cbc) 0.8 0 - 0.9   POC MID % 10.8 0 - 12 %M   POC Granulocyte 4.9 2 - 6.9   Granulocyte percent 64.2 37 - 80 %G   RBC 4.39 (A) 4.69 - 6.13 M/uL   Hemoglobin 15.3 14.1 - 18.1 g/dL   HCT, POC 40.9 (A) 81.1 - 53.7 %   MCV 98.6 (A) 80 - 97 fL   MCH, POC 34.9 (A) 27 - 31.2 pg   MCHC 35.3 31.8 - 35.4 g/dL   RDW, POC 91.4 %   Platelet Count, POC 233 142 - 424 K/uL   MPV 6.8 0 - 99.8 fL        Assessment & Plan:   Charles Hansen is a 49 y.o. male Fever, unspecified fever cause - Plan: POCT Microscopic Urinalysis ([facility]), POCT urinalysis dipstick, levofloxacin (LEVAQUIN) 500 MG tablet, Basic metabolic panel, POCT CBC, Ambulatory referral to  Urology  Urinary tract infection associated with catheterization of urinary tract, initial encounter - Plan: levofloxacin (LEVAQUIN) 500 MG tablet, Urine culture, POCT CBC, Ambulatory referral to Urology  Abnormal urine sediment - Plan: Basic metabolic panel, Ambulatory referral to Urology  Complicated UTI/catheter associated UTI. Afebrile and reassuring CBC, but recent fever. Hx of recent increased sediment noted.   -levaquin  QD for 10 days - SED and tendinopthy risk discussed.   -refer to urology for eval of sediment  and to determine if other routine testing/urodynamics needed with hx of T9 paraplegia and chronic need for self catheterization.   -BMP and urine cx pending.   Meds ordered this encounter  Medications  . levofloxacin (LEVAQUIN) 500 MG tablet    Sig: Take 1 tablet (500 mg total) by mouth daily.    Dispense:  10 tablet    Refill:  0   Patient Instructions  Start Levaquin for suspected urinary infection. Return to the clinic or go to the nearest emergency room if any of your symptoms worsen or new symptoms occur. Flu shot in 1 week (after current symptoms improve.  You should receive a call or letter about your lab results within the next week to 10 days.   I will refer you to urologist for ongoing care and to determine if other testing needed for sediment in urine.        By signing my name below, I, Rawaa Al Rifaie, attest that this documentation has been prepared under the direction and in the presence of Charles StaggersJeffrey Tirsa Gail, MD.  Broadus Johnawaa Al Rifaie, Medical Scribe. 02/20/2015.  12:23 PM.  I personally performed the services described in this documentation, which was scribed in my presence. The recorded information has been reviewed and considered, and addended by me as needed.

## 2015-02-22 LAB — URINE CULTURE: Colony Count: >=100000

## 2015-05-23 ENCOUNTER — Other Ambulatory Visit: Payer: Self-pay | Admitting: Orthopedic Surgery

## 2015-06-04 ENCOUNTER — Encounter (HOSPITAL_COMMUNITY): Payer: Self-pay

## 2015-06-04 ENCOUNTER — Encounter (HOSPITAL_COMMUNITY)
Admission: RE | Admit: 2015-06-04 | Discharge: 2015-06-04 | Disposition: A | Discharge status: Home / Self Care | Payer: 59 | Source: Ambulatory Visit | Attending: Orthopedic Surgery | Admitting: Orthopedic Surgery

## 2015-06-04 ENCOUNTER — Encounter (HOSPITAL_COMMUNITY): Admission: RE | Admit: 2015-06-04 | Payer: 59 | Source: Ambulatory Visit

## 2015-06-04 DIAGNOSIS — Z01812 Encounter for preprocedural laboratory examination: Secondary | ICD-10-CM | POA: Diagnosis not present

## 2015-06-04 DIAGNOSIS — Z01818 Encounter for other preprocedural examination: Secondary | ICD-10-CM | POA: Diagnosis present

## 2015-06-04 DIAGNOSIS — M4800 Spinal stenosis, site unspecified: Secondary | ICD-10-CM | POA: Insufficient documentation

## 2015-06-04 HISTORY — DX: Weakness: R53.1

## 2015-06-04 HISTORY — DX: Calculus in bladder: N21.0

## 2015-06-04 HISTORY — DX: Personal history of other diseases of the respiratory system: Z87.09

## 2015-06-04 HISTORY — DX: Dorsalgia, unspecified: M54.9

## 2015-06-04 HISTORY — DX: Personal history of other medical treatment: Z92.89

## 2015-06-04 LAB — URINE MICROSCOPIC-ADD ON

## 2015-06-04 LAB — URINALYSIS, ROUTINE W REFLEX MICROSCOPIC
Bilirubin Urine: NEGATIVE
Glucose, UA: NEGATIVE mg/dL
Hgb urine dipstick: NEGATIVE
Ketones, ur: NEGATIVE mg/dL
NITRITE: NEGATIVE
PROTEIN: NEGATIVE mg/dL
SPECIFIC GRAVITY, URINE: 1.005 (ref 1.005–1.030)
pH: 7 (ref 5.0–8.0)

## 2015-06-04 LAB — CBC WITH DIFFERENTIAL/PLATELET
BASOS PCT: 0 %
Basophils Absolute: 0 10*3/uL (ref 0.0–0.1)
EOS ABS: 0.1 10*3/uL (ref 0.0–0.7)
EOS PCT: 1 %
HCT: 43.1 % (ref 39.0–52.0)
HEMOGLOBIN: 14.7 g/dL (ref 13.0–17.0)
Lymphocytes Relative: 33 %
Lymphs Abs: 2.2 10*3/uL (ref 0.7–4.0)
MCH: 33.5 pg (ref 26.0–34.0)
MCHC: 34.1 g/dL (ref 30.0–36.0)
MCV: 98.2 fL (ref 78.0–100.0)
MONO ABS: 0.7 10*3/uL (ref 0.1–1.0)
MONOS PCT: 10 %
NEUTROS PCT: 56 %
Neutro Abs: 3.8 10*3/uL (ref 1.7–7.7)
PLATELETS: 268 10*3/uL (ref 150–400)
RBC: 4.39 MIL/uL (ref 4.22–5.81)
RDW: 13.3 % (ref 11.5–15.5)
WBC: 6.7 10*3/uL (ref 4.0–10.5)

## 2015-06-04 LAB — COMPREHENSIVE METABOLIC PANEL
ALBUMIN: 3.7 g/dL (ref 3.5–5.0)
ALT: 21 U/L (ref 17–63)
ANION GAP: 10 (ref 5–15)
AST: 24 U/L (ref 15–41)
Alkaline Phosphatase: 64 U/L (ref 38–126)
BUN: 9 mg/dL (ref 6–20)
CHLORIDE: 102 mmol/L (ref 101–111)
CO2: 30 mmol/L (ref 22–32)
Calcium: 9.7 mg/dL (ref 8.9–10.3)
Creatinine, Ser: 0.63 mg/dL (ref 0.61–1.24)
GFR calc non Af Amer: >60 mL/min (ref >60)
GLUCOSE: 100 mg/dL — AB (ref 65–99)
POTASSIUM: 4.2 mmol/L (ref 3.5–5.1)
SODIUM: 142 mmol/L (ref 135–145)
Total Bilirubin: 0.8 mg/dL (ref 0.3–1.2)
Total Protein: 6.5 g/dL (ref 6.5–8.1)

## 2015-06-04 LAB — PROTIME-INR
INR: 0.95 (ref 0.00–1.49)
Prothrombin Time: 12.9 seconds (ref 11.6–15.2)

## 2015-06-04 LAB — SURGICAL PCR SCREEN
MRSA, PCR: NEGATIVE
STAPHYLOCOCCUS AUREUS: NEGATIVE

## 2015-06-04 LAB — APTT: aPTT: 29 seconds (ref 24–37)

## 2015-06-04 MED ORDER — POVIDONE-IODINE 7.5 % EX SOLN: Freq: Once | CUTANEOUS | Status: DC

## 2015-06-04 NOTE — Progress Notes (Addendum)
Cardiologist denies  Medical Md is Dr.Jeffrey Neva Seat  Echo denies   Stress test denies  Heart cath denies  EKG denies having in past yr   CXR denies having in past yr

## 2015-06-04 NOTE — Progress Notes (Signed)
Lab sent pt home before CXR was done-will need to get DOS

## 2015-06-04 NOTE — Pre-Procedure Instructions (Signed)
Charles Hansen  06/04/2015      [facility] - [place], [place] - [address] [place] [address] Phone: [phone] Fax: [phone]    Your procedure is scheduled on Wed, Feb 15 @ 1:30 PM  Report to [facility] Admitting at 11:30 AM  Call this number if you have problems the morning of surgery:  [phone]   Remember:  Do not eat food or drink liquids after midnight.               No Goody's,BC's,Aleve,Ibuprofen,Aspirin,Advil,Motrin,Fish Oil,or any Herbal Medications.     Do not wear jewelry.  Do not wear lotions, powders, or colognes.  You may wear deodorant.  Men may shave face and neck.  Do not bring valuables to the hospital.  [facility] is not responsible for any belongings or valuables.  Contacts, dentures or bridgework may not be worn into surgery.  Leave your suitcase in the car.  After surgery it may be brought to your room.  For patients admitted to the hospital, discharge time will be determined by your treatment team.  Patients discharged the day of surgery will not be allowed to drive home.    Special instructions:  [place] - Preparing for Surgery  Before surgery, you can play an important role.  Because skin is not sterile, your skin needs to be as free of germs as possible.  You can reduce the number of germs on you skin by washing with CHG (chlorahexidine gluconate) soap before surgery.  CHG is an antiseptic cleaner which kills germs and bonds with the skin to continue killing germs even after washing.  Please DO NOT use if you have an allergy to CHG or antibacterial soaps.  If your skin becomes reddened/irritated stop using the CHG and inform your nurse when you arrive at Short Stay.  Do not shave (including legs and underarms) for at least 48 hours prior to the first CHG shower.  You may shave your face.  Please follow these instructions carefully:   1.  Shower with CHG Soap the night before  surgery and the                                morning of Surgery.  2.  If you choose to wash your hair, wash your hair first as usual with your       normal shampoo.  3.  After you shampoo, rinse your hair and body thoroughly to remove the                      Shampoo.  4.  Use CHG as you would any other liquid soap.  You can apply chg directly       to the skin and wash gently with scrungie or a clean washcloth.  5.  Apply the CHG Soap to your body ONLY FROM THE NECK DOWN.        Do not use on open wounds or open sores.  Avoid contact with your eyes,       ears, mouth and genitals (private parts).  Wash genitals (private parts)       with your normal soap.  6.  Wash thoroughly, paying special attention to the area where your surgery        will be performed.  7.  Thoroughly rinse your body with warm  water from the neck down.  8.  DO NOT shower/wash with your normal soap after using and rinsing off       the CHG Soap.  9.  Pat yourself dry with a clean towel.            10.  Wear clean pajamas.            11.  Place clean sheets on your bed the night of your first shower and do not        sleep with pets.  Day of Surgery  Do not apply any lotions/deoderants the morning of surgery.  Please wear clean clothes to the hospital/surgery center.    Please read over the following fact sheets that you were given. Pain Booklet, Coughing and Deep Breathing, MRSA Information and Surgical Site Infection Prevention

## 2015-06-10 MED ORDER — CEFAZOLIN SODIUM-DEXTROSE 2-3 GM-% IV SOLR
2.0000 g | INTRAVENOUS | Status: AC
  Administered 2015-06-11: 2 g via INTRAVENOUS
  Filled 2015-06-10: qty 50

## 2015-06-10 NOTE — H&P (Signed)
     PREOPERATIVE H&P  Chief Complaint: Bilateral hamstrings and quadriceps spasming  HPI: Charles Hansen is a 50 y.o. male who presents with ongoing symptoms in the bilateral legs  MRI reveals severe spinal stenosis at L4/5. The patient is also noted to have a degenerative lumbar scoliosis  Patient has failed multiple forms of conservative care and continues to have pain (see office notes for additional details regarding the patient's full course of treatment)  Past Medical History  Diagnosis Date  . Paraplegia (HCC)     T9  . History of bronchitis   . Weakness     numbness and tingling in hands  . Back pain     stenosis  . Bladder stone   . History of blood transfusion    Past Surgical History  Procedure Laterality Date  . Fracture surgery    . Hernia repair      as a baby  . Right index finger surgery    . T9-t10 laminectomy and fusion      Social History   Social History  . Marital Status: Married    Spouse Name: N/A  . Number of Children: N/A  . Years of Education: N/A   Social History Main Topics  . Smoking status: Never Smoker   . Smokeless tobacco: Not on file  . Alcohol Use: 0.0 oz/week    0 Standard drinks or equivalent per week  . Drug Use: No  . Sexual Activity: Yes   Other Topics Concern  . Not on file   Social History Narrative   Family History  Problem Relation Age of Onset  . Cancer Brother   . Heart disease Paternal Grandfather   . Hyperlipidemia Brother    Allergies  Allergen Reactions  . Bactrim [Sulfamethoxazole-Trimethoprim] Hives and Rash   Prior to Admission medications   Not on File     All other systems have been reviewed and were otherwise negative with the exception of those mentioned in the HPI and as above.  Physical Exam: There were no vitals filed for this visit.  General: Alert, no acute distress Cardiovascular: No pedal edema Respiratory: No cyanosis, no use of accessory musculature Skin: No lesions in the  area of chief complaint Neurologic: Sensation intact distally Psychiatric: Patient is competent for consent with normal mood and affect Lymphatic: No axillary or cervical lymphadenopathy  MUSCULOSKELETAL: 0/5 LE stength, no sensation b/l LEs  Assessment/Plan: Spinal stenosis Plan for Procedure(s): Lumbar 4-5 decompression. The patient understsands that he may need an extension of his previous fusion to the pelvis at a later date.  The patient did elect to proceed with the decompressive aspect of the procedure today, with additional potential surgery in the future, likely requiring a fusion to the pelvis.   Emilee Hero, MD 06/10/2015 1:10 PM

## 2015-06-11 ENCOUNTER — Ambulatory Visit (HOSPITAL_COMMUNITY)
Admission: RE | Admit: 2015-06-11 | Discharge: 2015-06-11 | Disposition: A | Discharge status: Home / Self Care | Payer: 59 | Source: Ambulatory Visit | Attending: Orthopedic Surgery | Admitting: Orthopedic Surgery

## 2015-06-11 ENCOUNTER — Ambulatory Visit (HOSPITAL_COMMUNITY): Payer: 59

## 2015-06-11 ENCOUNTER — Ambulatory Visit (HOSPITAL_COMMUNITY): Payer: 59 | Admitting: Vascular Surgery

## 2015-06-11 ENCOUNTER — Ambulatory Visit (HOSPITAL_COMMUNITY): Payer: 59 | Admitting: Anesthesiology

## 2015-06-11 ENCOUNTER — Encounter (HOSPITAL_COMMUNITY)
Admission: RE | Disposition: A | Discharge status: Home / Self Care | Payer: Self-pay | Source: Ambulatory Visit | Attending: Orthopedic Surgery

## 2015-06-11 ENCOUNTER — Encounter (HOSPITAL_COMMUNITY): Payer: Self-pay | Admitting: *Deleted

## 2015-06-11 DIAGNOSIS — M4806 Spinal stenosis, lumbar region: Secondary | ICD-10-CM | POA: Diagnosis present

## 2015-06-11 DIAGNOSIS — M7989 Other specified soft tissue disorders: Secondary | ICD-10-CM | POA: Diagnosis not present

## 2015-06-11 DIAGNOSIS — G8220 Paraplegia, unspecified: Secondary | ICD-10-CM | POA: Diagnosis not present

## 2015-06-11 DIAGNOSIS — Z01818 Encounter for other preprocedural examination: Secondary | ICD-10-CM

## 2015-06-11 DIAGNOSIS — Z419 Encounter for procedure for purposes other than remedying health state, unspecified: Secondary | ICD-10-CM

## 2015-06-11 HISTORY — PX: LUMBAR LAMINECTOMY/DECOMPRESSION MICRODISCECTOMY: SHX5026

## 2015-06-11 SURGERY — LUMBAR LAMINECTOMY/DECOMPRESSION MICRODISCECTOMY
Anesthesia: General

## 2015-06-11 MED ORDER — ONDANSETRON HCL 4 MG/2ML IJ SOLN
INTRAMUSCULAR | Status: DC | PRN
  Administered 2015-06-11: 4 mg via INTRAVENOUS

## 2015-06-11 MED ORDER — 0.9 % SODIUM CHLORIDE (POUR BTL) OPTIME
TOPICAL | Status: DC | PRN
  Administered 2015-06-11 (×2): 1000 mL

## 2015-06-11 MED ORDER — LIDOCAINE HCL (CARDIAC) 20 MG/ML IV SOLN
INTRAVENOUS | Status: DC | PRN
  Administered 2015-06-11: 60 mg via INTRAVENOUS

## 2015-06-11 MED ORDER — FENTANYL CITRATE (PF) 250 MCG/5ML IJ SOLN
INTRAMUSCULAR | Status: AC
  Filled 2015-06-11: qty 5

## 2015-06-11 MED ORDER — THROMBIN 20000 UNITS EX SOLR
CUTANEOUS | Status: DC | PRN
  Administered 2015-06-11: 14:00:00 via TOPICAL

## 2015-06-11 MED ORDER — SUGAMMADEX SODIUM 200 MG/2ML IV SOLN
INTRAVENOUS | Status: DC | PRN
  Administered 2015-06-11: 125 mg via INTRAVENOUS

## 2015-06-11 MED ORDER — ONDANSETRON HCL 4 MG/2ML IJ SOLN
INTRAMUSCULAR | Status: AC
  Filled 2015-06-11: qty 2

## 2015-06-11 MED ORDER — DEXAMETHASONE SODIUM PHOSPHATE 4 MG/ML IJ SOLN
INTRAMUSCULAR | Status: DC | PRN
  Administered 2015-06-11: 4 mg via INTRAVENOUS

## 2015-06-11 MED ORDER — DEXAMETHASONE SODIUM PHOSPHATE 4 MG/ML IJ SOLN
INTRAMUSCULAR | Status: AC
  Filled 2015-06-11: qty 1

## 2015-06-11 MED ORDER — HEMOSTATIC AGENTS (NO CHARGE) OPTIME
TOPICAL | Status: DC | PRN
  Administered 2015-06-11: 1 via TOPICAL

## 2015-06-11 MED ORDER — METHYLPREDNISOLONE ACETATE 40 MG/ML IJ SUSP
INTRAMUSCULAR | Status: AC
  Filled 2015-06-11: qty 1

## 2015-06-11 MED ORDER — SUGAMMADEX SODIUM 200 MG/2ML IV SOLN
INTRAVENOUS | Status: AC
  Filled 2015-06-11: qty 2

## 2015-06-11 MED ORDER — LIDOCAINE HCL (CARDIAC) 20 MG/ML IV SOLN
INTRAVENOUS | Status: AC
  Filled 2015-06-11: qty 5

## 2015-06-11 MED ORDER — BUPIVACAINE-EPINEPHRINE (PF) 0.25% -1:200000 IJ SOLN
INTRAMUSCULAR | Status: AC
  Filled 2015-06-11: qty 30

## 2015-06-11 MED ORDER — METHYLPREDNISOLONE ACETATE 40 MG/ML IJ SUSP
INTRAMUSCULAR | Status: DC | PRN
  Administered 2015-06-11: 40 mg

## 2015-06-11 MED ORDER — PHENYLEPHRINE HCL 10 MG/ML IJ SOLN
INTRAMUSCULAR | Status: DC | PRN
  Administered 2015-06-11: 40 ug via INTRAVENOUS
  Administered 2015-06-11: 80 ug via INTRAVENOUS
  Administered 2015-06-11 (×2): 40 ug via INTRAVENOUS
  Administered 2015-06-11 (×2): 80 ug via INTRAVENOUS
  Administered 2015-06-11: 40 ug via INTRAVENOUS

## 2015-06-11 MED ORDER — METHYLENE BLUE 0.5 % INJ SOLN
INTRAVENOUS | Status: DC | PRN
  Administered 2015-06-11: 1 mL

## 2015-06-11 MED ORDER — PROPOFOL 10 MG/ML IV BOLUS
INTRAVENOUS | Status: DC | PRN
  Administered 2015-06-11: 180 mg via INTRAVENOUS

## 2015-06-11 MED ORDER — BUPIVACAINE-EPINEPHRINE 0.25% -1:200000 IJ SOLN
INTRAMUSCULAR | Status: DC | PRN
  Administered 2015-06-11: 2 mL

## 2015-06-11 MED ORDER — ROCURONIUM BROMIDE 50 MG/5ML IV SOLN
INTRAVENOUS | Status: AC
  Filled 2015-06-11: qty 1

## 2015-06-11 MED ORDER — ROCURONIUM BROMIDE 100 MG/10ML IV SOLN
INTRAVENOUS | Status: DC | PRN
Start: 2015-06-11 — End: 2015-06-11
  Administered 2015-06-11: 40 mg via INTRAVENOUS

## 2015-06-11 MED ORDER — PHENYLEPHRINE HCL 10 MG/ML IJ SOLN
10.0000 mg | INTRAMUSCULAR | Status: DC | PRN
  Administered 2015-06-11: 15 ug/min via INTRAVENOUS

## 2015-06-11 MED ORDER — FENTANYL CITRATE (PF) 100 MCG/2ML IJ SOLN
INTRAMUSCULAR | Status: DC | PRN
  Administered 2015-06-11: 150 ug via INTRAVENOUS

## 2015-06-11 MED ORDER — PROPOFOL 10 MG/ML IV BOLUS
INTRAVENOUS | Status: AC
  Filled 2015-06-11: qty 20

## 2015-06-11 MED ORDER — LACTATED RINGERS IV SOLN
INTRAVENOUS | Status: DC
Start: 2015-06-11 — End: 2015-06-11
  Administered 2015-06-11 (×2): via INTRAVENOUS

## 2015-06-11 MED ORDER — MIDAZOLAM HCL 2 MG/2ML IJ SOLN
INTRAMUSCULAR | Status: AC
  Filled 2015-06-11: qty 2

## 2015-06-11 MED ORDER — THROMBIN 20000 UNITS EX SOLR
CUTANEOUS | Status: AC
  Filled 2015-06-11: qty 20000

## 2015-06-11 MED ORDER — MIDAZOLAM HCL 5 MG/5ML IJ SOLN
INTRAMUSCULAR | Status: DC | PRN
  Administered 2015-06-11: 2 mg via INTRAVENOUS

## 2015-06-11 SURGICAL SUPPLY — 71 items
BENZOIN TINCTURE PRP APPL 2/3 (GAUZE/BANDAGES/DRESSINGS) IMPLANT
BUR ROUND PRECISION 4.0 (BURR) ×2 IMPLANT
CANISTER SUCTION 2500CC (MISCELLANEOUS) ×2 IMPLANT
CARTRIDGE OIL MAESTRO DRILL (MISCELLANEOUS) ×1 IMPLANT
CLOSURE STERI-STRIP 1/4X4 (GAUZE/BANDAGES/DRESSINGS) ×2 IMPLANT
CORDS BIPOLAR (ELECTRODE) ×2 IMPLANT
COVER SURGICAL LIGHT HANDLE (MISCELLANEOUS) ×2 IMPLANT
DIFFUSER DRILL AIR PNEUMATIC (MISCELLANEOUS) ×2 IMPLANT
DRAIN CHANNEL 15F RND FF W/TCR (WOUND CARE) IMPLANT
DRAPE POUCH INSTRU U-SHP 10X18 (DRAPES) ×4 IMPLANT
DRAPE SURG 17X23 STRL (DRAPES) ×8 IMPLANT
DURAPREP 26ML APPLICATOR (WOUND CARE) ×2 IMPLANT
ELECT BLADE 4.0 EZ CLEAN MEGAD (MISCELLANEOUS) ×2
ELECT CAUTERY BLADE 6.4 (BLADE) ×2 IMPLANT
ELECT REM PT RETURN 9FT ADLT (ELECTROSURGICAL) ×2
ELECTRODE BLDE 4.0 EZ CLN MEGD (MISCELLANEOUS) ×1 IMPLANT
ELECTRODE REM PT RTRN 9FT ADLT (ELECTROSURGICAL) ×1 IMPLANT
EVACUATOR SILICONE 100CC (DRAIN) IMPLANT
FILTER STRAW FLUID ASPIR (MISCELLANEOUS) ×2 IMPLANT
GAUZE SPONGE 4X4 12PLY STRL (GAUZE/BANDAGES/DRESSINGS) IMPLANT
GAUZE SPONGE 4X4 16PLY XRAY LF (GAUZE/BANDAGES/DRESSINGS) ×2 IMPLANT
GLOVE BIO SURGEON STRL SZ7 (GLOVE) ×4 IMPLANT
GLOVE BIO SURGEON STRL SZ8 (GLOVE) ×2 IMPLANT
GLOVE BIOGEL PI IND STRL 6.5 (GLOVE) ×1 IMPLANT
GLOVE BIOGEL PI IND STRL 7.0 (GLOVE) ×2 IMPLANT
GLOVE BIOGEL PI IND STRL 8 (GLOVE) ×1 IMPLANT
GLOVE BIOGEL PI INDICATOR 6.5 (GLOVE) ×1
GLOVE BIOGEL PI INDICATOR 7.0 (GLOVE) ×2
GLOVE BIOGEL PI INDICATOR 8 (GLOVE) ×1
GLOVE SURG SS PI 6.5 STRL IVOR (GLOVE) ×2 IMPLANT
GOWN STRL REUS W/ TWL LRG LVL3 (GOWN DISPOSABLE) ×3 IMPLANT
GOWN STRL REUS W/ TWL XL LVL3 (GOWN DISPOSABLE) ×1 IMPLANT
GOWN STRL REUS W/TWL LRG LVL3 (GOWN DISPOSABLE) ×3
GOWN STRL REUS W/TWL XL LVL3 (GOWN DISPOSABLE) ×1
IV CATH 14GX2 1/4 (CATHETERS) ×2 IMPLANT
KIT BASIN OR (CUSTOM PROCEDURE TRAY) ×2 IMPLANT
KIT POSITION SURG JACKSON T1 (MISCELLANEOUS) ×2 IMPLANT
KIT ROOM TURNOVER OR (KITS) ×2 IMPLANT
NEEDLE 18GX1X1/2 (RX/OR ONLY) (NEEDLE) ×2 IMPLANT
NEEDLE 22X1 1/2 (OR ONLY) (NEEDLE) ×2 IMPLANT
NEEDLE HYPO 25GX1X1/2 BEV (NEEDLE) ×2 IMPLANT
NEEDLE SPNL 18GX3.5 QUINCKE PK (NEEDLE) ×4 IMPLANT
NS IRRIG 1000ML POUR BTL (IV SOLUTION) ×2 IMPLANT
OIL CARTRIDGE MAESTRO DRILL (MISCELLANEOUS) ×2
PACK LAMINECTOMY ORTHO (CUSTOM PROCEDURE TRAY) ×2 IMPLANT
PACK UNIVERSAL I (CUSTOM PROCEDURE TRAY) ×2 IMPLANT
PAD ARMBOARD 7.5X6 YLW CONV (MISCELLANEOUS) ×6 IMPLANT
PATTIES SURGICAL .5 X.5 (GAUZE/BANDAGES/DRESSINGS) ×2 IMPLANT
PATTIES SURGICAL .5 X1 (DISPOSABLE) ×2 IMPLANT
SPONGE GAUZE 4X4 12PLY STER LF (GAUZE/BANDAGES/DRESSINGS) ×2 IMPLANT
SPONGE INTESTINAL PEANUT (DISPOSABLE) ×2 IMPLANT
SPONGE SURGIFOAM ABS GEL 100 (HEMOSTASIS) ×2 IMPLANT
SPONGE SURGIFOAM ABS GEL SZ50 (HEMOSTASIS) ×2 IMPLANT
STRIP CLOSURE SKIN 1/2X4 (GAUZE/BANDAGES/DRESSINGS) IMPLANT
SUT MNCRL AB 4-0 PS2 18 (SUTURE) ×2 IMPLANT
SUT VIC AB 0 CT1 18XCR BRD 8 (SUTURE) IMPLANT
SUT VIC AB 0 CT1 27 (SUTURE)
SUT VIC AB 0 CT1 27XBRD ANBCTR (SUTURE) IMPLANT
SUT VIC AB 0 CT1 8-18 (SUTURE)
SUT VIC AB 1 CT1 18XCR BRD 8 (SUTURE) ×1 IMPLANT
SUT VIC AB 1 CT1 8-18 (SUTURE) ×1
SUT VIC AB 2-0 CT2 18 VCP726D (SUTURE) ×2 IMPLANT
SYR 20CC LL (SYRINGE) ×2 IMPLANT
SYR BULB IRRIGATION 50ML (SYRINGE) ×2 IMPLANT
SYR CONTROL 10ML LL (SYRINGE) ×4 IMPLANT
SYR TB 1ML LUER SLIP (SYRINGE) ×4 IMPLANT
TAPE CLOTH SURG 4X10 WHT LF (GAUZE/BANDAGES/DRESSINGS) ×2 IMPLANT
TOWEL OR 17X24 6PK STRL BLUE (TOWEL DISPOSABLE) ×2 IMPLANT
TOWEL OR 17X26 10 PK STRL BLUE (TOWEL DISPOSABLE) ×2 IMPLANT
WATER STERILE IRR 1000ML POUR (IV SOLUTION) IMPLANT
YANKAUER SUCT BULB TIP NO VENT (SUCTIONS) ×2 IMPLANT

## 2015-06-11 NOTE — Anesthesia Postprocedure Evaluation (Signed)
Anesthesia Post Note  Patient: Ephraim Reichel  Procedure(s) Performed: Procedure(s) (LRB): Lumbar 4-5 decompression (N/A)  Patient location during evaluation: PACU Anesthesia Type: General Level of consciousness: awake and alert Pain management: pain level controlled Vital Signs Assessment: post-procedure vital signs reviewed and stable Respiratory status: spontaneous breathing, nonlabored ventilation, respiratory function stable and patient connected to nasal cannula oxygen Cardiovascular status: blood pressure returned to baseline and stable Postop Assessment: no signs of nausea or vomiting Anesthetic complications: no    Last Vitals:  Filed Vitals:   06/11/15 1630 06/11/15 1645  BP: 107/68 111/66  Pulse: 55 54  Temp:  37 C  Resp: 16 18    Last Pain: There were no vitals filed for this visit.               Amarii Bordas J

## 2015-06-11 NOTE — Anesthesia Preprocedure Evaluation (Addendum)
Anesthesia Evaluation  Patient identified by MRN, date of birth, ID band Patient awake    Reviewed: Allergy & Precautions, H&P , Patient's Chart, lab work & pertinent test results, reviewed documented beta blocker date and time   Airway Mallampati: II  TM Distance: >3 FB Neck ROM: full    Dental no notable dental hx. (+) Teeth Intact, Dental Advisory Given   Pulmonary    Pulmonary exam normal breath sounds clear to auscultation       Cardiovascular  Rhythm:regular Rate:Normal     Neuro/Psych    GI/Hepatic   Endo/Other    Renal/GU      Musculoskeletal   Abdominal   Peds  Hematology   Anesthesia Other Findings Paraplegia (HCC)  T9  History of bronchitis      Weakness  numbness and tingling in hands  Back pain stenosis           Reproductive/Obstetrics                            Anesthesia Physical Anesthesia Plan  ASA: II  Anesthesia Plan: General   Post-op Pain Management:    Induction: Intravenous  Airway Management Planned: Oral ETT  Additional Equipment:   Intra-op Plan:   Post-operative Plan: Extubation in OR  Informed Consent: I have reviewed the patients History and Physical, chart, labs and discussed the procedure including the risks, benefits and alternatives for the proposed anesthesia with the patient or authorized representative who has indicated his/her understanding and acceptance.   Dental Advisory Given and Dental advisory given  Plan Discussed with: CRNA and Surgeon  Anesthesia Plan Comments: (  Discussed general anesthesia, including possible nausea, instrumentation of airway, sore throat,pulmonary aspiration, etc. I asked if the were any outstanding questions, or  concerns before we proceeded. )        Anesthesia Quick Evaluation

## 2015-06-11 NOTE — Op Note (Signed)
NAMEELENO, WEIMAR NO.:  [id number]  MEDICAL RECORD NO.:  [id number]  LOCATION:  [facility]                         FACILITY:  [facility]  PHYSICIAN:  Estill Bamberg, MD      DATE OF BIRTH:  07/11/1965  DATE OF PROCEDURE:  06/11/2015                              OPERATIVE REPORT   PREOPERATIVE DIAGNOSES: 1. L4-5 spinal stenosis. 2. Bilateral leg spasming and swelling. 3. 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  POSTOPERATIVE DIAGNOSES: 1. L4-5 spinal stenosis. 2. Bilateral leg spasming and swelling. 3. 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  PROCEDURE:  L4-5 laminectomy with bilateral partial facetectomy and bilateral foraminotomy.  SURGEON:  Estill Bamberg, MD  ASSISTANT:  Jason Coop, PA-C  ANESTHESIA:  General endotracheal anesthesia.  COMPLICATIONS:  None.  DISPOSITION:  Stable.  ESTIMATED BLOOD LOSS:  Minimal.  INDICATIONS FOR SURGERY:  Briefly, Mr. Howington is a very pleasant 50 year old male, who did initially present to me with spasming in his bilateral legs.  The patient was very convinced that he did have a neurologic issue in his spine that was resulting in bilateral leg spasming and swelling.  An MRI did reveal moderate-to-severe stenosis at the L4-5 level.  I did explain to the patient that it was unclear as to whether this particular finding was causing his symptoms, but the possibility certainly did exist that it was.  We did discuss options.  The patient did wish to proceed with a decompressive procedure at the L4-5 level. Of note, the patient does have a scoliotic deformity of the lumbar spine which did result in an ischial tuberosity ulcer as well.  I did explain to the patient that he may need an extension of his fusion down to the sacrum at some point in the future.  He did understand this, but did  wish to proceed with a decompressive procedure.    OPERATIVE DETAILS:  On June 11, 2015, patient was brought to surgery and general endotracheal anesthesia was administered.  The patient was placed prone on a well-padded flat Jackson bed with a Wilson frame. Antibiotics were given and a time-out procedure was performed.  The back was prepped and draped.  I then made a midline incision overlying the L4- 5 intervertebral space.  I then removed the L4 spinous process and proceeded with a central and bilateral partial facetectomy, thereby fully and adequately decompressing the spinal canal at the L4-5 level. A bilateral foraminotomy was also performed.  I was very pleased with the decompression.  All epidural bleeding was controlled using bipolar electrocautery in addition to Surgiflo.  There was no extravasation of cerebrospinal fluid.  The wound was then closed in layers using #1 Vicryl followed by 2-0 Vicryl, followed by 3-0 Monocryl.  Benzoin and Steri-Strips were applied followed by sterile dressing.  All instrument counts were correct at the termination of the procedure.  Of note, Jason Coop was my assistant throughout surgery, and did aid in retraction, suctioning, and closure from start to finish.     Estill Bamberg,  MD     MD/MEDQ  D:  06/11/2015  T:  06/11/2015  Job:  [id number]

## 2015-06-11 NOTE — Anesthesia Procedure Notes (Signed)
Procedure Name: Intubation Date/Time: 06/11/2015 12:47 PM Performed by: Lovie Chol Pre-anesthesia Checklist: Patient identified, Emergency Drugs available, Suction available, Patient being monitored and Timeout performed Patient Re-evaluated:Patient Re-evaluated prior to inductionOxygen Delivery Method: Circle system utilized Preoxygenation: Pre-oxygenation with 100% oxygen Intubation Type: IV induction Ventilation: Mask ventilation without difficulty Laryngoscope Size: Miller and 2 Grade View: Grade I Tube type: Oral Tube size: 7.5 mm Number of attempts: 1 Airway Equipment and Method: Stylet Placement Confirmation: ETT inserted through vocal cords under direct vision,  positive ETCO2,  CO2 detector and breath sounds checked- equal and bilateral Secured at: 22 cm Tube secured with: Tape Dental Injury: Teeth and Oropharynx as per pre-operative assessment

## 2015-06-11 NOTE — Transfer of Care (Signed)
Immediate Anesthesia Transfer of Care Note  Patient: Charles Hansen  Procedure(s) Performed: Procedure(s) with comments: Lumbar 4-5 decompression (N/A) - Lumbar 4-5 decompression  Patient Location: PACU  Anesthesia Type:General  Level of Consciousness: awake, alert , oriented and patient cooperative  Airway & Oxygen Therapy: Patient Spontanous Breathing and Patient connected to nasal cannula oxygen  Post-op Assessment: Report given to RN and Post -op Vital signs reviewed and stable  Post vital signs: Reviewed  Last Vitals:  Filed Vitals:   06/11/15 0947 06/11/15 1613  BP: 110/66 110/69  Pulse: 62 66  Temp: 36.4 C 36.7 C  Resp: 20 10    Complications: No apparent anesthesia complications

## 2015-06-12 ENCOUNTER — Encounter (HOSPITAL_COMMUNITY): Payer: Self-pay | Admitting: Orthopedic Surgery

## 2015-06-12 MED FILL — Thrombin For Soln Kit 20000 Unit: CUTANEOUS | Qty: 1 | Status: AC

## 2015-07-08 ENCOUNTER — Telehealth: Payer: Self-pay | Admitting: Family Medicine

## 2015-07-08 ENCOUNTER — Telehealth: Payer: Self-pay

## 2015-07-08 NOTE — Telephone Encounter (Signed)
Patient was returning Charles Hansen's call regarding the flu shot.  He said he has not received it, but he will come into the office to get it asap.

## 2015-07-08 NOTE — Telephone Encounter (Signed)
Left a message for patent to return call about the flu shot.  If they have had it where and when, if not they need to come by and receive it. 

## 2015-07-09 ENCOUNTER — Ambulatory Visit: Payer: 59 | Admitting: Diagnostic Neuroimaging

## 2015-07-18 ENCOUNTER — Encounter: Payer: Self-pay | Admitting: Neurology

## 2015-07-18 ENCOUNTER — Ambulatory Visit (INDEPENDENT_AMBULATORY_CARE_PROVIDER_SITE_OTHER): Payer: 59 | Admitting: Neurology

## 2015-07-18 VITALS — BP 120/70 | HR 69 | Resp 12

## 2015-07-18 DIAGNOSIS — S24103S Unspecified injury at T7-T10 level of thoracic spinal cord, sequela: Secondary | ICD-10-CM

## 2015-07-18 DIAGNOSIS — G909 Disorder of the autonomic nervous system, unspecified: Secondary | ICD-10-CM

## 2015-07-18 DIAGNOSIS — G901 Familial dysautonomia [Riley-Day]: Secondary | ICD-10-CM

## 2015-07-18 DIAGNOSIS — G839 Paralytic syndrome, unspecified: Secondary | ICD-10-CM | POA: Diagnosis not present

## 2015-07-18 DIAGNOSIS — G8220 Paraplegia, unspecified: Secondary | ICD-10-CM

## 2015-07-18 NOTE — Progress Notes (Signed)
[facility] Neu[facility] Clinic Note - Initial Visit   Date: 07/18/2015  Arnoldo Hildreth MRN: [id number] DOB: 1966-04-10   Dear Dr. Neva Seat:  Thank you for your kind referral of Charles Hansen for consultation of leg spasms and hyperhidrosis. Although his history is well known to you, please allow [place] to reiterate it for the purpose of our medical record. The patient was accompanied to the clinic by self.    History of Present Illness: Rykin Route is a 50 y.o. left-handed Caucasian male with history of T9 burst fracture resulting in bilateral paraplegia and neurogenic bladder/bowel (1991) presenting for evaluation of bilateral leg spasms and increased sweating.  He leads a very active lifestyle despite his spinal cord injury.     Patient has been paraplegic since 1991 after suffering a fall from a two story building balcony which resulted in T9 vertebral burst fracture and dislocation.  He underwent extensive surgery with thoracic fusion from T4-L2.  He has been wheelchair-dependent since this time.  Other complications include neurogenic bowel and bladder.  For the first year following his injury, he had spasticity, but for a long time afterwards, he did not have any issues with muscle spasms or spasticity.  Over years, he has developed progressive strucutral dysfunction of the lumbar spine and rotation of the pelvis.    Starting in 2016, he began noticing increased sweating in the legs and spasticity of the legs.  Sweating involves the entire body from his injury down.  Prolonged sitting triggers symptoms and more constant later in the day.  Positional changes may help the sweating a little.  His bedsheet and clothes can be drenched when severe.  He gets spells of bilateral leg spasms which is worse at nighttime and wakes him up from sleeping at times.  He denies any palpitations, headaches, or elevated blood pressure during this time.   In the past, he has experienced these  spells which always have been due to underlying infection and when treated the symptoms would resolve.  Despite treating his skin breakdown and UTI, he does not have any improvement of leg spasms and sweating where now symptoms occur daily.  He progressive lumbar deformity which has lead to pressure ulcer over the left ischial tuberosity, but this has not been severe.  He saw Dr. Yevette Edwards in early 2017 for these symptoms and discussed possible extensive revisionand fusion to the hip, second opinion, or simple decompression with the understanding that he may need future surgery again.  He underwent imaging of the lumbar spine and noted L4-5 spinal stenosis for which he underwent L4-5 laminectomy, bilateral partial facetectomy and foraminotomy on 06/10/2015, but has not noticed any improvement.  Out-side paper records, electronic medical record, and images have been reviewed where available and summarized as:  CT thoracic spine wo contrast 10/22/2014:  Status post T4-L2 fusion. Hardware is intact and there is a solid posterior fusion mass throughout. The central canal and foramina appear open at all levels.  CT lumbar spine wo contrast 10/22/2014:  Convex left lumbar scoliosis. Disc bulge ligamentum flavum thickening at L4-5 appear to cause moderate central canal narrowing.  Degenerated disc at L2-3 with a shallow bulge seen. The central canal appears only mildly narrowed at this level. Mild to moderate foraminal narrowing appears more notable on the right   Past Medical History  Diagnosis Date  . Paraplegia (HCC)     T9  . History of bronchitis   . Weakness     numbness and tingling in  hands  . Back pain     stenosis  . Bladder stone   . History of blood transfusion     Past Surgical History  Procedure Laterality Date  . Fracture surgery    . Hernia repair      as a baby  . Right index finger surgery    . T9-t10 laminectomy and fusion     . Lumbar laminectomy/decompression  microdiscectomy N/A 06/11/2015    Procedure: Lumbar 4-5 decompression;  Surgeon: Estill Bamberg, MD;  Location: [facility] OR;  Service: Orthopedics;  Laterality: N/A;  Lumbar 4-5 decompression    Medications:   None  Allergies:  Allergies  Allergen Reactions  . Bactrim [Sulfamethoxazole-Trimethoprim] Hives and Rash    Family History: Family History  Problem Relation Age of Onset  . Cancer Brother   . Heart disease Paternal Grandfather   . Hyperlipidemia Brother     Social History: Social History  Substance Use Topics  . Smoking status: Never Smoker   . Smokeless tobacco: Never Used  . Alcohol Use: 0.0 oz/week    0 Standard drinks or equivalent per week   Social History   Social History Narrative   Lives with wife and 2 kids in a one story home.  Owns a couple of businesses.  Education: Scientist, water quality.    Review of Systems:  CONSTITUTIONAL: No fevers, chills, night sweats, or weight loss.   EYES: No visual changes or eye pain ENT: No hearing changes.  No history of nose bleeds.   RESPIRATORY: No cough, wheezing and shortness of breath.   CARDIOVASCULAR: Negative for chest pain, and palpitations.   GI: Negative for abdominal discomfort, blood in stools or black stools.  No recent change in bowel habits.   GU:  +neurogenic bowel and bladder.   MUSCLOSKELETAL: No history of joint pain or swelling.  No myalgias.   SKIN: Negative for lesions, +rash, and itching.   HEMATOLOGY/ONCOLOGY: Negative for prolonged bleeding, bruising easily, and swollen nodes.  No history of cancer.   ENDOCRINE: Negative for cold or heat intolerance, polydipsia or goiter.   PSYCH:  No depression or anxiety symptoms.   NEURO: As Above.   Vital Signs:  BP 120/70 mmHg  Pulse 69  Resp 12  SpO2 97%   General Medical Exam:   General:  Well appearing, comfortable, sitting in wheelchair.   Eyes/ENT: see cranial nerve examination.   Neck: No masses appreciated.  Full range of motion without tenderness.    Respiratory:  Clear to auscultation, good air entry bilaterally.   Cardiac:  Regular rate and rhythm, no murmur.   Extremities:  Deformity due to mechanical hardware placement in the back with protrusion.  Skin:  Hyperhidrosis noted from T9 distally on the back, moreso than on the legs.  His feet are cool to touch, especially on the left.  There is evidence of superficial fungal rash over the back >> abdomen.   Neurological Exam: MENTAL STATUS including orientation to time, place, person, recent and remote memory, attention span and concentration, language, and fund of knowledge is normal.  Speech is not dysarthric.  CRANIAL NERVES: II:  No visual field defects.  Unremarkable fundi.   III-IV-VI: Pupils equal round and reactive to light.  Normal conjugate, extra-ocular eye movements in all directions of gaze.  No nystagmus.   V:  Normal facial sensation.  VII:  Normal facial symmetry and movements.  VIII:  Normal hearing and vestibular function.   IX-X:  Normal palatal movement.   XI:  Normal shoulder shrug and head rotation.   XII:  Normal tongue strength and range of motion, no deviation or fasciculation.  MOTOR:  Prominent lower extremity atrophy bilaterally.   No abnormal movements. No pronator drift. Motor strength of the upper extremity is 5/5 bilaterally with normal tone.  He is paraplegic in the lower extremities with mild spasticity (left > right). Bilateral knee and ankle contractures.  MSRs:  Right                                                                 Left brachioradialis 2+  brachioradialis 2+  biceps 2+  biceps 2+  triceps 2+  triceps 2+  patellar 0  Patellar 0  ankle jerk 0  ankle jerk 0  Hoffman no  Hoffman no  plantar response Triple flexion  plantar response Triple flexion   SENSORY:  Normal and symmetric perception of light touch, pinprick, vibration, and proprioception in the upper extremities.  Absent sensation to all modalities distal to T9  dermatome.  COORDINATION/GAIT: Normal finger-to- nose-finger.  Gait is not checked as patient is wheelchair bound.   IMPRESSION: Mr. Fredric MareBailey is a delightful 50 year-old gentleman with history of T9 burst fracture causing complete spinal cord injury resulting in bilateral paraplegia.  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  Because autonomic symptoms are more common in SCI above the level of T6, I will order MRI thoracic spine wwo contrast to look for new structural pathology, such as syrinx.  He is aware that infection, impaction, or other stressors to the body can trigger these spells.  He has left ischial skin breakdown and generalized cutaneous fungal infection which have both been stable.  We discussed pharmacological options for spasticity and hyperhidrosis, but patient prefers to avoid medications.  I also offered formal physical therapy for back and leg stretching, however, he has been very complaint doing home exercises himself, which is clearly evident as I would expect greater spasticity by now.  He does have notable knee and ankle contractures which does not interfere with hygiene or transfers.   Further recommendations will be based on the results of the imaging.    The duration of this appointment visit was 60 minutes of face-to-face time with the patient.  Greater than 50% of this time was spent in counseling, explanation of diagnosis, planning of further management, and coordination of care.   Thank you for allowing me to participate in patient's care.  If I can answer any additional questions, I would be pleased to do so.    Sincerely,    Donika K. Allena KatzPatel, DO

## 2015-07-18 NOTE — Patient Instructions (Signed)
MRI thoracic spine with and without contrast.  We will call you with the results

## 2015-07-22 NOTE — Progress Notes (Signed)
Note routed

## 2015-07-23 ENCOUNTER — Ambulatory Visit (INDEPENDENT_AMBULATORY_CARE_PROVIDER_SITE_OTHER): Payer: 59 | Admitting: Family Medicine

## 2015-07-23 VITALS — BP 112/64 | HR 60 | Temp 98.0°F | Resp 16

## 2015-07-23 DIAGNOSIS — R509 Fever, unspecified: Secondary | ICD-10-CM | POA: Diagnosis not present

## 2015-07-23 DIAGNOSIS — N3090 Cystitis, unspecified without hematuria: Secondary | ICD-10-CM | POA: Diagnosis not present

## 2015-07-23 DIAGNOSIS — Z789 Other specified health status: Secondary | ICD-10-CM | POA: Diagnosis not present

## 2015-07-23 DIAGNOSIS — R609 Edema, unspecified: Secondary | ICD-10-CM | POA: Diagnosis not present

## 2015-07-23 LAB — POCT URINALYSIS DIP (MANUAL ENTRY)
BILIRUBIN UA: NEGATIVE
BILIRUBIN UA: NEGATIVE
GLUCOSE UA: NEGATIVE
Nitrite, UA: NEGATIVE
Protein Ur, POC: NEGATIVE
SPEC GRAV UA: 1.01
Urobilinogen, UA: 0.2
pH, UA: 6.5

## 2015-07-23 LAB — POCT CBC
Granulocyte percent: 75.3 %G (ref 37–80)
HCT, POC: 38.5 % — AB (ref 43.5–53.7)
Hemoglobin: 14 g/dL — AB (ref 14.1–18.1)
LYMPH, POC: 1.8 (ref 0.6–3.4)
MCH, POC: 35.8 pg — AB (ref 27–31.2)
MCHC: 36.3 g/dL — AB (ref 31.8–35.4)
MCV: 98.4 fL — AB (ref 80–97)
MID (CBC): 0.9 (ref 0–0.9)
MPV: 6.6 fL (ref 0–99.8)
PLATELET COUNT, POC: 286 10*3/uL (ref 142–424)
POC Granulocyte: 8.3 — AB (ref 2–6.9)
POC LYMPH %: 16.4 % (ref 10–50)
POC MID %: 8.3 %M (ref 0–12)
RBC: 3.91 M/uL — AB (ref 4.69–6.13)
RDW, POC: 12.9 %
WBC: 11 10*3/uL — AB (ref 4.6–10.2)

## 2015-07-23 LAB — POC MICROSCOPIC URINALYSIS (UMFC): MUCUS RE: ABSENT

## 2015-07-23 LAB — COMPREHENSIVE METABOLIC PANEL
ALBUMIN: 3.8 g/dL (ref 3.6–5.1)
ALT: 11 U/L (ref 9–46)
AST: 13 U/L (ref 10–40)
Alkaline Phosphatase: 63 U/L (ref 40–115)
BILIRUBIN TOTAL: 0.6 mg/dL (ref 0.2–1.2)
BUN: 10 mg/dL (ref 7–25)
CALCIUM: 9.5 mg/dL (ref 8.6–10.3)
CO2: 28 mmol/L (ref 20–31)
CREATININE: 0.49 mg/dL — AB (ref 0.60–1.35)
Chloride: 104 mmol/L (ref 98–110)
Glucose, Bld: 92 mg/dL (ref 65–99)
Potassium: 4.4 mmol/L (ref 3.5–5.3)
SODIUM: 141 mmol/L (ref 135–146)
TOTAL PROTEIN: 6.4 g/dL (ref 6.1–8.1)

## 2015-07-23 LAB — POCT SEDIMENTATION RATE: POCT SED RATE: 60 mm/hr — AB (ref 0–22)

## 2015-07-23 LAB — POCT INFLUENZA A/B
Influenza A, POC: NEGATIVE
Influenza B, POC: NEGATIVE

## 2015-07-23 MED ORDER — AMOXICILLIN-POT CLAVULANATE 875-125 MG PO TABS: 1.0000 | ORAL_TABLET | Freq: Two times a day (BID) | ORAL | Status: DC

## 2015-07-23 NOTE — Progress Notes (Signed)
Subjective:    Patient ID: Charles Hansen, male    DOB: [birth year], 50 y.o.   MRN: [id number]  HPI This is a 50 yo male who presents today with fever x 6 days at 4 pm. Running 99-101. Plateaus in the evening and resolves with tylenol. Felt fatigue one day, no cough, some muscle aches with fever. Fever seems to break which is accompanied by sweats. No chest pain, no SOB, no palpitations. Has had family member (child) diagnosed with flu recently.   He has T9 paraplegia since 1991. Had spinal surgery 06/11/15, Dr. Yevette Edwards. Has noticed fullness at incision site since he removed the bandage shortly after surgery. He has no sensation in the area. His wife has been checking it. There has not been any redness. It has not changed in size.   Past Medical History  Diagnosis Date  . Paraplegia (HCC)     T9  . History of bronchitis   . Weakness     numbness and tingling in hands  . Back pain     stenosis  . Bladder stone   . History of blood transfusion    Past Surgical History  Procedure Laterality Date  . Fracture surgery    . Hernia repair      as a baby  . Right index finger surgery    . T9-t10 laminectomy and fusion     . Lumbar laminectomy/decompression microdiscectomy N/A 06/11/2015    Procedure: Lumbar 4-5 decompression;  Surgeon: Estill Bamberg, MD;  Location: [facility] OR;  Service: Orthopedics;  Laterality: N/A;  Lumbar 4-5 decompression   Family History  Problem Relation Age of Onset  . Prostate cancer Brother   . Heart disease Paternal Grandfather   . Hyperlipidemia Brother   . Arthritis-Osteo Father     Living, 25  . Arthritis-Osteo Mother     Living, 32  . Dementia Mother   . Macular degeneration Mother   . Healthy Sister   . Healthy Son   . Healthy Daughter    Social History  Substance Use Topics  . Smoking status: Never Smoker   . Smokeless tobacco: Never Used  . Alcohol Use: 0.0 oz/week    0 Standard drinks or equivalent per week     Comment: 2-3 glasses daily x 10  years      Review of Systems  Constitutional: Positive for fever, chills, diaphoresis and fatigue.  HENT: Negative for congestion, ear pain, rhinorrhea and sore throat.   Respiratory: Negative for cough, shortness of breath and wheezing.   Cardiovascular: Negative for chest pain and palpitations.  Gastrointestinal: Negative for nausea, vomiting, diarrhea and constipation.  Skin:       Swelling at surgical site of back  Neurological: Positive for headaches.       Objective:   Physical Exam  Constitutional: He is oriented to person, place, and time. He appears well-developed and well-nourished. No distress.  HENT:  Head: Normocephalic and atraumatic.  Nose: Nose normal.  Mouth/Throat: Oropharynx is clear and moist.  Eyes: Conjunctivae are normal. Pupils are equal, round, and reactive to light.  Neck: Normal range of motion. Neck supple.  Cardiovascular: Normal rate, regular rhythm and normal heart sounds.   Pulmonary/Chest: Effort normal and breath sounds normal.  Lymphadenopathy:    He has no cervical adenopathy.  Neurological: He is alert and oriented to person, place, and time.  Skin: Skin is dry. He is not diaphoretic.  Lumbar back with well healed surgical scar. Has area of fullness  12 cm long x 6 cm wide. Feels superficial, soft, no erythema, no drainage, no streaking.   Psychiatric: He has a normal mood and affect. His behavior is normal. Judgment and thought content normal.  Vitals reviewed.    BP 112/64 mmHg  Pulse 60  Temp(Src) 98 F (36.7 C) (Oral)  Resp 16  SpO2 98% Results for orders placed or performed in visit on 07/23/15  POCT urinalysis dipstick  Result Value Ref Range   Color, UA yellow yellow   Clarity, UA clear clear   Glucose, UA negative negative   Bilirubin, UA negative negative   Ketones, POC UA negative negative   Spec Grav, UA 1.010    Blood, UA trace-lysed (A) negative   pH, UA 6.5    Protein Ur, POC negative negative   Urobilinogen, UA  0.2    Nitrite, UA Negative Negative   Leukocytes, UA moderate (2+) (A) Negative  POCT Microscopic Urinalysis ([facility])  Result Value Ref Range   WBC,UR,HPF,POC Too numerous to count  (A) None WBC/hpf   RBC,UR,HPF,POC None None RBC/hpf   Bacteria Many (A) None, Too numerous to count   Mucus Absent Absent   Epithelial Cells, UR Per Microscopy Few (A) None, Too numerous to count cells/hpf  POCT CBC  Result Value Ref Range   WBC 11.0 (A) 4.6 - 10.2 K/uL   Lymph, poc 1.8 0.6 - 3.4   POC LYMPH PERCENT 16.4 10 - 50 %L   MID (cbc) 0.9 0 - 0.9   POC MID % 8.3 0 - 12 %M   POC Granulocyte 8.3 (A) 2 - 6.9   Granulocyte percent 75.3 37 - 80 %G   RBC 3.91 (A) 4.69 - 6.13 M/uL   Hemoglobin 14.0 (A) 14.1 - 18.1 g/dL   HCT, POC 16.138.5 (A) 09.643.5 - 53.7 %   MCV 98.4 (A) 80 - 97 fL   MCH, POC 35.8 (A) 27 - 31.2 pg   MCHC 36.3 (A) 31.8 - 35.4 g/dL   RDW, POC 04.512.9 %   Platelet Count, POC 286 142 - 424 K/uL   MPV 6.6 0 - 99.8 fL  POCT Influenza A/B  Result Value Ref Range   Influenza A, POC Negative Negative   Influenza B, POC Negative Negative     Assessment & Plan:  Discussed with Dr. Clelia CroftShaw 1. Fever, unspecified - POCT urinalysis dipstick - POCT Microscopic Urinalysis ([facility]) - POCT CBC - POCT SEDIMENTATION RATE - POCT Influenza A/B - Comprehensive metabolic panel - Urine culture  2. Self-catheterizes urinary bladder - POCT urinalysis dipstick - POCT Microscopic Urinalysis ([facility]) - Urine culture  3. Cystitis - Urine culture - amoxicillin-clavulanate (AUGMENTIN) 875-125 MG tablet; Take 1 tablet by mouth 2 (two) times daily.  Dispense: 20 tablet; Refill: 0 - RTC instructions provided- fevers after 48 hours on antibiotics, persistent vomiting, fatigue or weakness  4. Soft tissue swelling of back - this sounds like it has been stable for the last month, does not appear to be infected. He has a follow up scheduled in 2 days with Dr. Ander Sladeumonski   Deborah Gessner, FNP-BC  [facility]  07/23/2015 2:42 PM

## 2015-07-23 NOTE — Patient Instructions (Addendum)
   IF you received an x-ray today, you will receive an invoice from [facility]. Please contact [facility] at [phone] with questions or concerns regarding your invoice.   IF you received labwork today, you will receive an invoice from [facility]. Please contact [facility] at [phone] with questions or concerns regarding your invoice.   Our billing staff will not be able to assist you with questions regarding bills from these companies.  You will be contacted with the lab results as soon as they are available. The fastest way to get your results is to activate your My Chart account. Instructions are located on the last page of this paperwork. If you have not heard from us regarding the results in 2 weeks, please contact this office.     Urinary Tract Infection Urinary tract infections (UTIs) can develop anywhere along your urinary tract. Your urinary tract is your body's drainage system for removing wastes and extra water. Your urinary tract includes two kidneys, two ureters, a bladder, and a urethra. Your kidneys are a pair of bean-shaped organs. Each kidney is about the size of your fist. They are located below your ribs, one on each side of your spine. CAUSES Infections are caused by microbes, which are microscopic organisms, including fungi, viruses, and bacteria. These organisms are so small that they can only be seen through a microscope. Bacteria are the microbes that most commonly cause UTIs. SYMPTOMS  Symptoms of UTIs may vary by age and gender of the patient and by the location of the infection. Symptoms in young women typically include a frequent and intense urge to urinate and a painful, burning feeling in the bladder or urethra during urination. Older women and men are more likely to be tired, shaky, and weak and have muscle aches and abdominal pain. A fever may mean the infection is in your kidneys. Other symptoms of a kidney  infection include pain in your back or sides below the ribs, nausea, and vomiting. DIAGNOSIS To diagnose a UTI, your caregiver will ask you about your symptoms. Your caregiver will also ask you to provide a urine sample. The urine sample will be tested for bacteria and white blood cells. White blood cells are made by your body to help fight infection. TREATMENT  Typically, UTIs can be treated with medication. Because most UTIs are caused by a bacterial infection, they usually can be treated with the use of antibiotics. The choice of antibiotic and length of treatment depend on your symptoms and the type of bacteria causing your infection. HOME CARE INSTRUCTIONS  If you were prescribed antibiotics, take them exactly as your caregiver instructs you. Finish the medication even if you feel better after you have only taken some of the medication.  Drink enough water and fluids to keep your urine clear or pale yellow.  Avoid caffeine, tea, and carbonated beverages. They tend to irritate your bladder.  Empty your bladder often. Avoid holding urine for long periods of time.  Empty your bladder before and after sexual intercourse.  After a bowel movement, women should cleanse from front to back. Use each tissue only once. SEEK MEDICAL CARE IF:   You have back pain.  You develop a fever.  Your symptoms do not begin to resolve within 3 days. SEEK IMMEDIATE MEDICAL CARE IF:   You have severe back pain or lower abdominal pain.  You develop chills.  You have nausea or vomiting.  You have continued burning or discomfort with urination.   MAKE SURE YOU:   Understand these instructions.  Will watch your condition.  Will get help right away if you are not doing well or get worse.   This information is not intended to replace advice given to you by your health care provider. Make sure you discuss any questions you have with your health care provider.   Document Released: 01/20/2005 Document  Revised: 01/01/2015 Document Reviewed: 05/21/2011 Elsevier Interactive Patient Education 2016 Elsevier Inc.  

## 2015-07-24 NOTE — Progress Notes (Signed)
Patient ID: Charles Hansen, male   DOB: 05/17/1965, 50 y.o.   MRN: [id number] Reviewed documentation and agree w/ assessment and plan. Norberto SorensonEva Rahman Ferrall, MD MPH

## 2015-07-25 LAB — URINE CULTURE

## 2015-07-29 ENCOUNTER — Inpatient Hospital Stay: Admission: RE | Admit: 2015-07-29 | Payer: 59 | Source: Ambulatory Visit

## 2015-08-08 ENCOUNTER — Other Ambulatory Visit: Payer: Self-pay | Admitting: Family Medicine

## 2015-08-08 ENCOUNTER — Encounter: Payer: Self-pay | Admitting: *Deleted

## 2015-08-08 DIAGNOSIS — R700 Elevated erythrocyte sedimentation rate: Secondary | ICD-10-CM

## 2015-08-10 IMAGING — CR DG KNEE COMPLETE 4+V*L*
4 series · 4 of 4 positions shown · non-contrast
Comparison: None.

CLINICAL DATA: Knee pain.  Injury.

EXAM:
LEFT KNEE - COMPLETE 4+ VIEW

[AP]
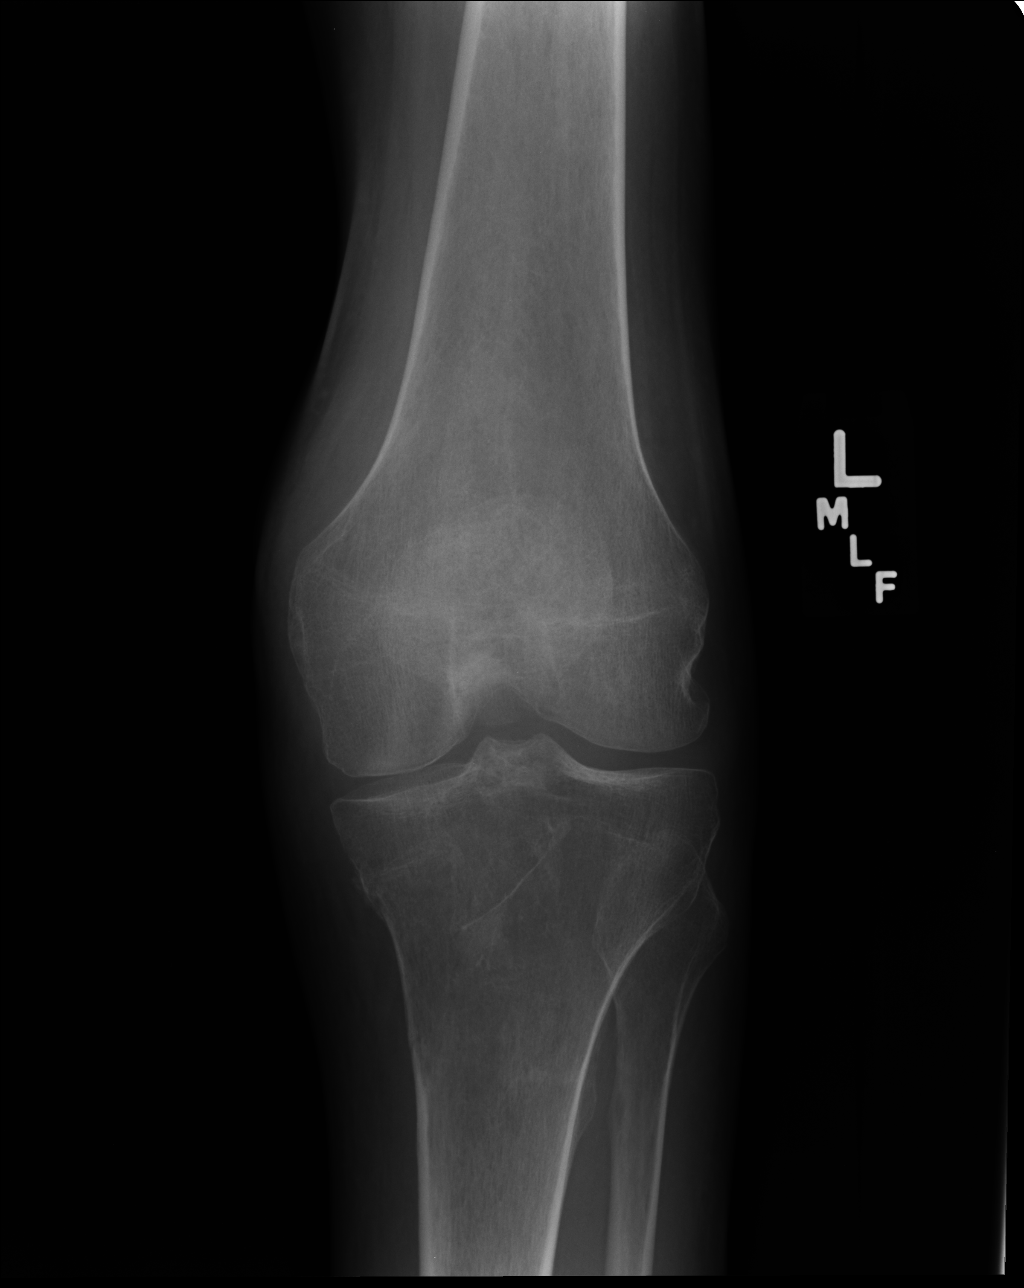

[lateral]
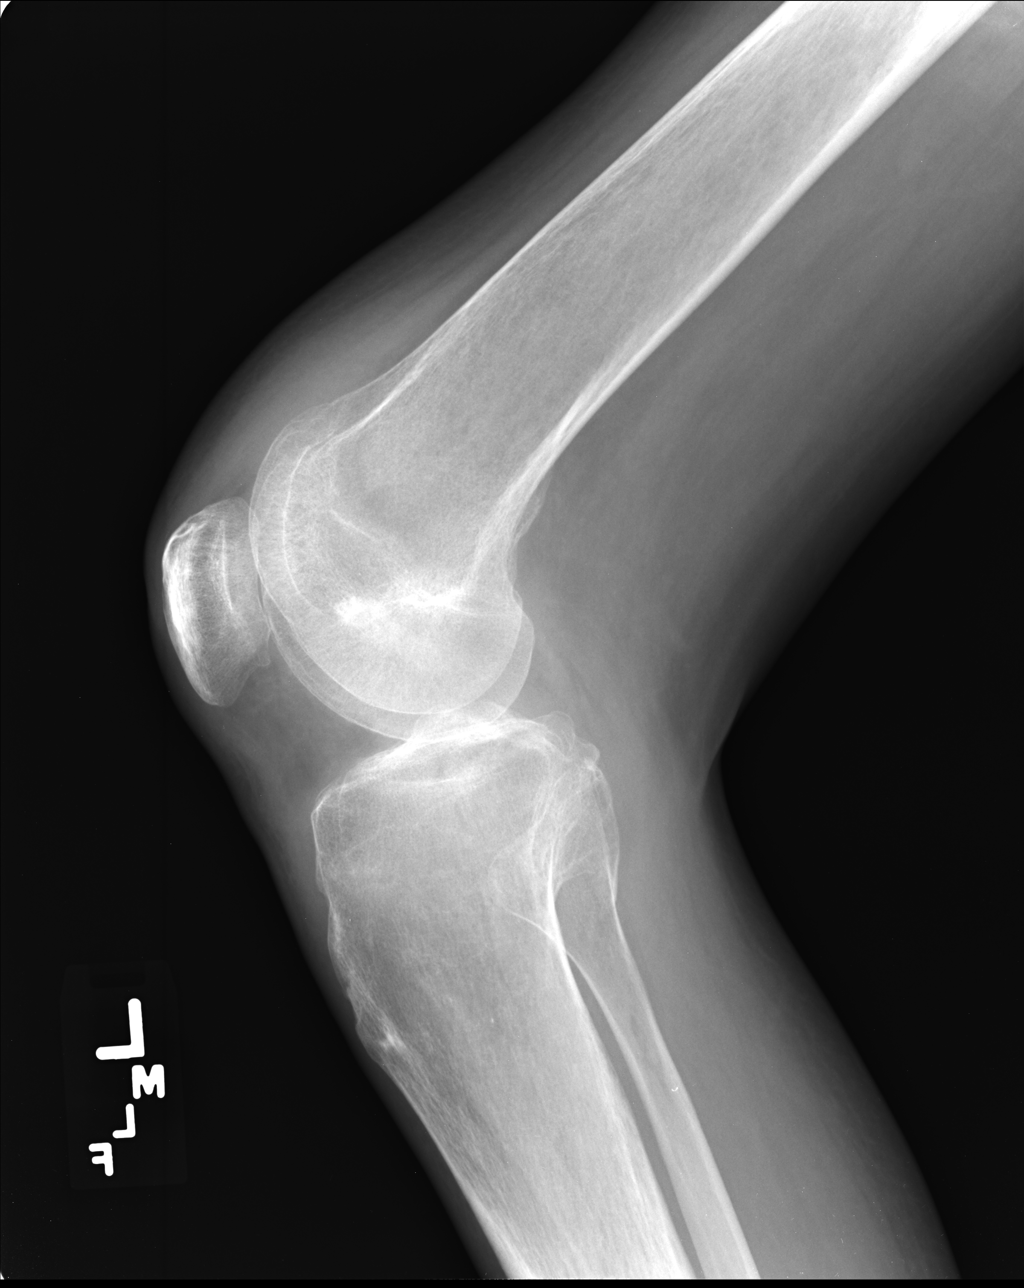

[ap ext rot]
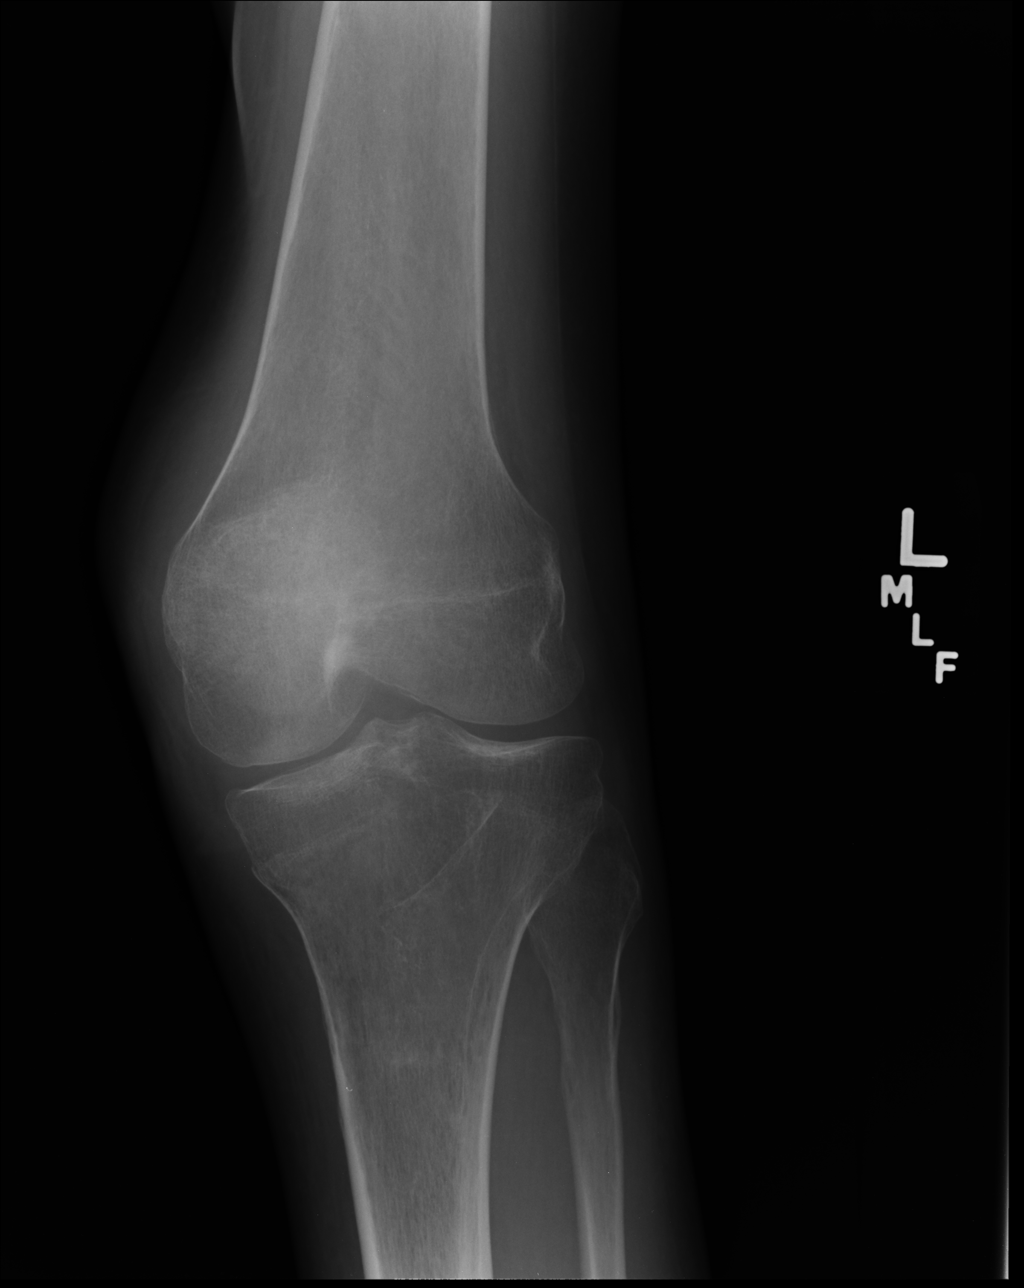

[ap int rot]
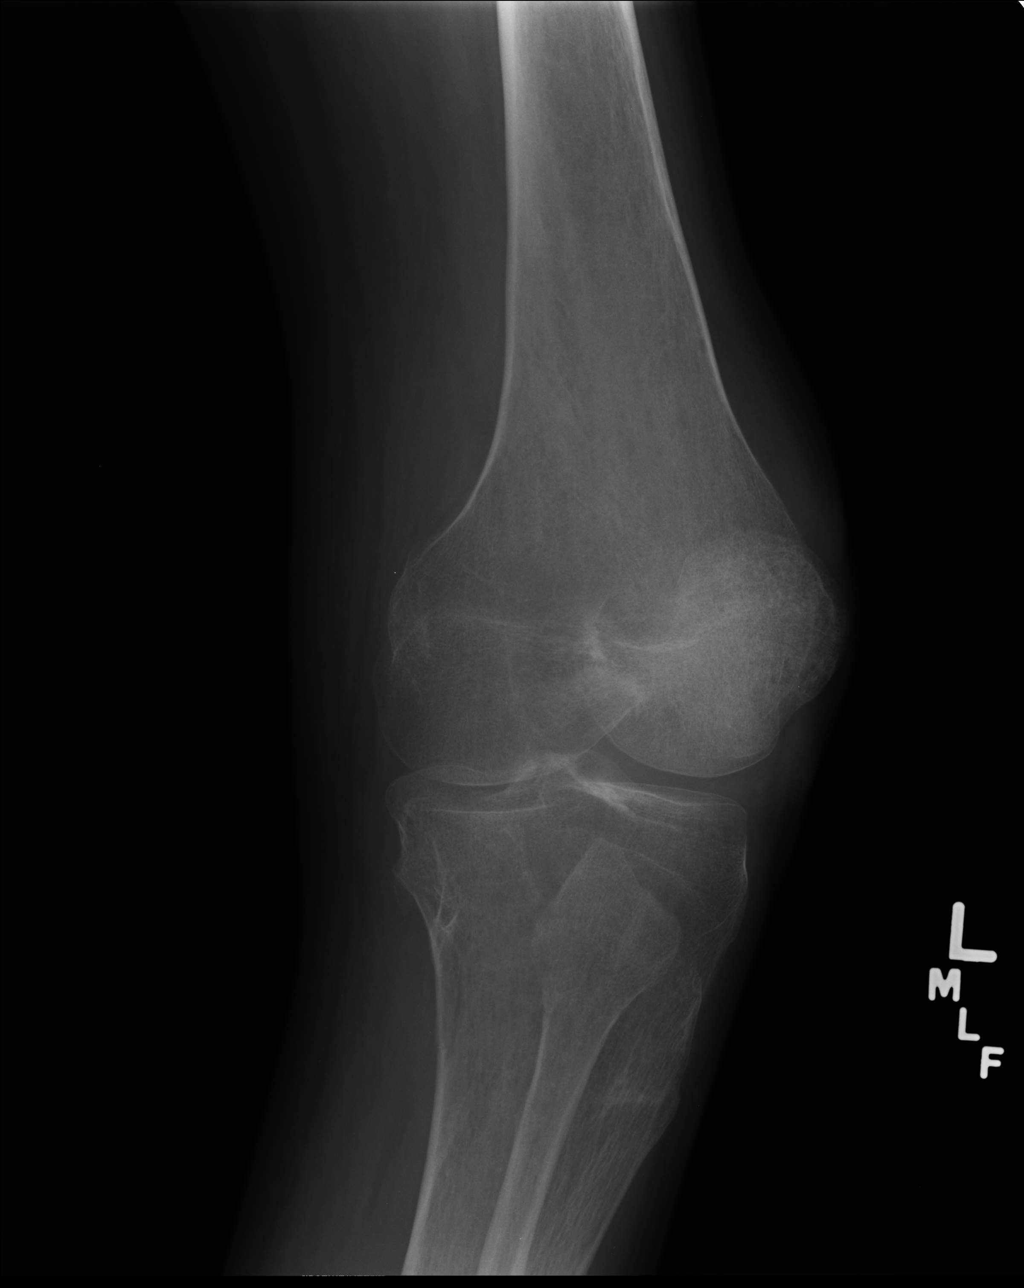

[4 of 4 positions shown; findings below may reference images not displayed]

FINDINGS: Cortical irregularity noted within the medial proximal tibia
concerning for medial tibial plateau fracture. This is nondisplaced.
Small joint effusion present. Diffuse osteopenia. No additional
acute bony abnormality.
IMPRESSION: Findings suspicious for medial tibial plateau fracture.

## 2015-08-12 ENCOUNTER — Other Ambulatory Visit (INDEPENDENT_AMBULATORY_CARE_PROVIDER_SITE_OTHER): Payer: 59 | Admitting: *Deleted

## 2015-08-12 DIAGNOSIS — R700 Elevated erythrocyte sedimentation rate: Secondary | ICD-10-CM

## 2015-08-12 LAB — C-REACTIVE PROTEIN: CRP: <0.5 mg/dL (ref <0.60)

## 2015-08-12 LAB — SEDIMENTATION RATE: Sed Rate: 26 mm/hr — ABNORMAL HIGH (ref 0–15)

## 2015-08-12 NOTE — Progress Notes (Signed)
Lab only visit 

## 2015-09-08 ENCOUNTER — Encounter: Payer: Self-pay | Admitting: *Deleted

## 2017-03-09 NOTE — Progress Notes (Signed)
Mr. Fredric MareBailey has received his flu shot today at [facility] to RT deltoid Lot # [id number] NDC: [id number] Mfg: [employer] Expiroes: 10/23/17

## 2017-09-15 DIAGNOSIS — N3000 Acute cystitis without hematuria: Secondary | ICD-10-CM | POA: Diagnosis not present

## 2017-09-15 DIAGNOSIS — N312 Flaccid neuropathic bladder, not elsewhere classified: Secondary | ICD-10-CM | POA: Diagnosis not present

## 2017-09-15 DIAGNOSIS — N390 Urinary tract infection, site not specified: Secondary | ICD-10-CM | POA: Diagnosis not present

## 2017-09-15 DIAGNOSIS — B9620 Unspecified Escherichia coli [E. coli] as the cause of diseases classified elsewhere: Secondary | ICD-10-CM | POA: Diagnosis not present

## 2017-11-10 DIAGNOSIS — Z125 Encounter for screening for malignant neoplasm of prostate: Secondary | ICD-10-CM | POA: Diagnosis not present

## 2017-11-10 DIAGNOSIS — Z0000 Encounter for general adult medical examination without abnormal findings: Secondary | ICD-10-CM | POA: Diagnosis not present

## 2017-11-10 DIAGNOSIS — Z114 Encounter for screening for human immunodeficiency virus [HIV]: Secondary | ICD-10-CM | POA: Diagnosis not present

## 2017-11-10 DIAGNOSIS — Z13220 Encounter for screening for lipoid disorders: Secondary | ICD-10-CM | POA: Diagnosis not present

## 2017-11-10 DIAGNOSIS — Z1329 Encounter for screening for other suspected endocrine disorder: Secondary | ICD-10-CM | POA: Diagnosis not present

## 2017-11-15 DIAGNOSIS — T148XXA Other injury of unspecified body region, initial encounter: Secondary | ICD-10-CM | POA: Diagnosis not present

## 2017-11-15 DIAGNOSIS — N390 Urinary tract infection, site not specified: Secondary | ICD-10-CM | POA: Diagnosis not present

## 2017-11-15 DIAGNOSIS — M419 Scoliosis, unspecified: Secondary | ICD-10-CM | POA: Diagnosis not present

## 2017-11-15 DIAGNOSIS — Z23 Encounter for immunization: Secondary | ICD-10-CM | POA: Diagnosis not present

## 2017-11-15 DIAGNOSIS — L8990 Pressure ulcer of unspecified site, unspecified stage: Secondary | ICD-10-CM | POA: Diagnosis not present

## 2017-11-15 DIAGNOSIS — Z681 Body mass index (BMI) 19 or less, adult: Secondary | ICD-10-CM | POA: Diagnosis not present

## 2017-11-15 DIAGNOSIS — G8220 Paraplegia, unspecified: Secondary | ICD-10-CM | POA: Diagnosis not present

## 2017-11-15 DIAGNOSIS — Z0000 Encounter for general adult medical examination without abnormal findings: Secondary | ICD-10-CM | POA: Diagnosis not present

## 2017-11-17 ENCOUNTER — Encounter: Payer: Self-pay | Admitting: Gastroenterology

## 2017-12-22 DIAGNOSIS — Z23 Encounter for immunization: Secondary | ICD-10-CM | POA: Diagnosis not present

## 2018-01-13 ENCOUNTER — Ambulatory Visit (INDEPENDENT_AMBULATORY_CARE_PROVIDER_SITE_OTHER): Payer: BLUE CROSS/BLUE SHIELD | Admitting: Gastroenterology

## 2018-01-13 ENCOUNTER — Encounter: Payer: Self-pay | Admitting: Gastroenterology

## 2018-01-13 ENCOUNTER — Encounter (INDEPENDENT_AMBULATORY_CARE_PROVIDER_SITE_OTHER): Payer: Self-pay

## 2018-01-13 VITALS — BP 118/68 | HR 68

## 2018-01-13 DIAGNOSIS — Z1211 Encounter for screening for malignant neoplasm of colon: Secondary | ICD-10-CM

## 2018-01-13 DIAGNOSIS — G8220 Paraplegia, unspecified: Secondary | ICD-10-CM | POA: Diagnosis not present

## 2018-01-13 NOTE — Progress Notes (Signed)
GASTROENTEROLOGY OUTPATIENT CLINIC VISIT   Primary Care Provider Lewis Moccasinewey, Elizabeth R, MD [address] [place] [id number] [phone]  Referring Provider Shade FloodGreene, Jeffrey R, MD [address] [place] [address] [phone]  Patient Profile: Charles JubaMatthew Hansen is a 52 y.o. 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  The patient presents to the [facility] for an evaluation and management of problem(s) noted below:  Problem List 1. Colon cancer screening   2. Paraplegia at T9 level [facility](HCC)     History of Present Illness: This is the patient's first visit to the [facility].  He is referred by his primary care provider to discuss next steps in evaluation for colon cancer screening.  He is never had a screening examination of any sort.  He has no family history of colon cancer or other GI malignancies.  He has been paralyzed for many years.  He performs a digital examination every 2 days to remove his stool burden from his rectum.  He will occasionally use a soapsuds enema (Enamase) every so often as well to help with cleaning out his colon.  The very, very infrequently would notice bleeding from his hemorrhoids after he digital exam.  He describes no other significant GI symptoms.  He has not had a previous upper or lower endoscopy.  He takes nonsteroidals periodically.  GI Review of Systems Positive as above Negative for pyrosis, dysphagia, odynophagia, abdominal pain, nausea, vomiting, melena, jaundice  Review of Systems General: Denies fevers/chills/weight loss HEENT: Denies oral lesions Cardiovascular: Denies chest pain Pulmonary: Denies shortness of breath Gastroenterological: See HPI Genitourinary: Denies darkened urine Hematological: Denies easy bruising Dermatological: No skin breakdown Psychological: Mood is stable Musculoskeletal: Denies new arthralgias   Medications Current Outpatient  Medications  Medication Sig Dispense Refill  . naproxen sodium (ALEVE) 220 MG tablet Take 220 mg by mouth as needed.     No current facility-administered medications for this visit.     Allergies Allergies  Allergen Reactions  . Bactrim [Sulfamethoxazole-Trimethoprim] Hives and Rash    Histories Past Medical History:  Diagnosis Date  . Back pain    stenosis  . Bladder stone   . History of blood transfusion   . History of bronchitis   . Paraplegia (HCC)    T9  . Weakness    numbness and tingling in hands   Past Surgical History:  Procedure Laterality Date  . FRACTURE SURGERY    . HERNIA REPAIR     as a baby  . LUMBAR LAMINECTOMY/DECOMPRESSION MICRODISCECTOMY N/A 06/11/2015   Procedure: Lumbar 4-5 decompression;  Surgeon: Estill BambergMark Dumonski, MD;  Location: [facility] OR;  Service: Orthopedics;  Laterality: N/A;  Lumbar 4-5 decompression  . right index finger surgery    . T9-T10 laminectomy and fusion      Social History   Socioeconomic History  . Marital status: Married    Spouse name: Not on file  . Number of children: 2  . Years of education: Not on file  . Highest education level: Not on file  Occupational History  . Occupation: disabled  Social Needs  . Financial resource strain: Not on file  . Food insecurity:    Worry: Not on file    Inability: Not on file  . Transportation needs:    Medical: Not on file    Non-medical: Not on file  Tobacco Use  . Smoking status: Never Smoker  . Smokeless tobacco: Never Used  Substance and Sexual Activity  . Alcohol use: Yes    Alcohol/week: 0.0 standard drinks    Comment: 2-3 glasses daily   . Drug use: No  . Sexual activity: Yes  Lifestyle  . Physical activity:    Days per week: Not on file    Minutes per session: Not on file  . Stress: Not on file  Relationships  . Social connections:    Talks on phone: Not on file    Gets together: Not on file    Attends religious service: Not on file    Active member of club or  organization: Not on file    Attends meetings of clubs or organizations: Not on file    Relationship status: Not on file  . Intimate partner violence:    Fear of current or ex partner: Not on file    Emotionally abused: Not on file    Physically abused: Not on file    Forced sexual activity: Not on file  Other Topics Concern  . Not on file  Social History Narrative   Lives with wife and 2 kids in a one story home.     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   Family History  Problem Relation Age of Onset  . Arthritis-Osteo Mother        Living, 1  . Macular degeneration Mother   . Arthritis-Osteo Father        Living, 58  . Prostate cancer Brother   . Heart disease Paternal Grandfather   . Hyperlipidemia Brother   . Healthy Sister   . Healthy Son   . Healthy Daughter   . Colon cancer Neg Hx   . Colon polyps Neg Hx   . Esophageal cancer Neg Hx   . Inflammatory bowel disease Neg Hx   . Liver disease Neg Hx   . Pancreatic cancer Neg Hx   . Rectal cancer Neg Hx   . Stomach cancer Neg Hx    I have reviewed his medical, social, and family history in detail and updated the electronic medical record as necessary.    PHYSICAL EXAMINATION  BP 118/68 (BP Location: Left Arm, Patient Position: Sitting, Cuff Size: Normal)   Pulse 68  Wt Readings from Last 3 Encounters:  06/11/15 131 lb (59.4 kg)  06/04/15 131 lb (59.4 kg)  08/05/14 131 lb (59.4 kg)  GEN: NAD, appears stated age, doesn't appear chronically ill PSYCH: Cooperative, without pressured speech EYE: Conjunctivae pink, sclerae anicteric ENT: MMM NECK: Supple CV: RR without R/Gs  RESP: No adventitious sounds present GI: NABS, soft, NT/ND, without rebound or guarding, no HSM appreciated GU: DRE shows external hemorrhoids, with brown stool in vault and likely internal hemorrhoids and no fissures noted MSK/EXT: No lower extremity edema (legs are thin) SKIN: No concerning  rashes NEURO:  Alert & Oriented x 3, no focal deficits   REVIEW OF DATA  I reviewed the following data at the time of this encounter:  GI Procedures and Studies  Relevant studies  Laboratory Studies  Reviewed in epic however labs are out of date and will need to obtain referring/PCP labs drawn within the last couple months per patient report  Imaging Studies  No relevant studies   ASSESSMENT  Charles Hansen is a 53 y.o. 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  The patient is seen today for evaluation and management of:  1.  Colon cancer screening   2. Paraplegia at T9 level [facility])    This is a very healthy gentleman who is an average risk screening candidate for colon cancer screening.  We discussed the role of colon cancer screening with the use of colonoscopy versus Cologuard/FIT testing.  The patient has expressed an interest in trying to minimize changes in his bowel habits unless absolutely necessary.  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  He understands this but would also like to try and minimize his risk of having colon cancer in the future.  He does understand that a Cologuard test evaluate for colon cancer but small adenomatous lesions may be missed as this test is not indicated for colon polyp screening.  The patient would like to proceed with a Cologuard after our discussion and I think this is reasonable.  We will plan for him to obtain this in if it returns positive then he will require a colonoscopy in the hospital because of his underlying paraplegia.  All patient questions were answered, to the best of my ability, and the patient agrees to the aforementioned plan of action with follow-up as indicated.   PLAN  1. Colon cancer screening - Plan for Cologuard and follow-up based on findings - Patient recounts  he has had recent blood work showing a normal blood count (I would like to review this as long as he has a normal hemogram and normal MCV then we would not plan to send iron studies to rule out need for true endoscopic evaluation-we are working to obtain this)  2. Paraplegia at T9 level (HCC)   No orders of the defined types were placed in this encounter.   New Prescriptions   No medications on file   Modified Medications   No medications on file    Planned Follow Up: No follow-ups on file.   Corliss Parish, MD [facility] Advanced Endoscopy Office # [phone]

## 2018-01-13 NOTE — Patient Instructions (Signed)
If you are age 51 or older, your body mass index should be between 23-30. Your There is no height or weight on file to calculate BMI. If this is out of the aforementioned range listed, please consider follow up with your Primary Care Provider.  If you are age 51 or younger, your body mass index should be between 19-25. Your There is no height or weight on file to calculate BMI. If this is out of the aformentioned range listed, please consider follow up with your Primary Care Provider.   Your provider has ordered Cologuard testing as an option for colon cancer screening. This is performed by [facility] and may be out of network with your insurance. PRIOR to completing the test, it is YOUR responsibility to contact your insurance about covered benefits for this test. Your out of pocket expense could be anywhere from $0.00 to $649.00.   When you call to check coverage with your insurer, please provide the following information:   -The ONLY provider of Cologuard is [facility]  - CPT code for Cologuard is [phone].  [facility] NPI # [id number]  -[facility] Tax ID # [id number]   We have already sent your demographic and insurance information to [facility] (phone number [phone]) and they should contact you within the next week regarding your test. If you have not heard from them within the next week, please call our office at [phone].  We have requested labs from Dr. Duanne Guessewey.  .Thank you for choosing me and [facility].   Corliss ParishGabriel Mansouraty, MD

## 2018-01-16 ENCOUNTER — Encounter: Payer: Self-pay | Admitting: Gastroenterology

## 2018-01-16 DIAGNOSIS — Z1211 Encounter for screening for malignant neoplasm of colon: Secondary | ICD-10-CM | POA: Insufficient documentation

## 2018-01-23 DIAGNOSIS — Z1212 Encounter for screening for malignant neoplasm of rectum: Secondary | ICD-10-CM | POA: Diagnosis not present

## 2018-01-23 DIAGNOSIS — Z1211 Encounter for screening for malignant neoplasm of colon: Secondary | ICD-10-CM | POA: Diagnosis not present

## 2018-01-24 ENCOUNTER — Encounter: Payer: Self-pay | Admitting: Gastroenterology

## 2018-01-24 DIAGNOSIS — R718 Other abnormality of red blood cells: Secondary | ICD-10-CM

## 2018-01-24 NOTE — Progress Notes (Signed)
Review of records from Dr. Sarina Ill. July 2019 urine culture July 2019 wound culture July 2019 urinalysis normal July 2019 labs Hemoglobin 15 Hematocrit 43 MCV 101.4 (this is elevated) Platelet count 264,000 BUN over creatinine 12/0.7 AST over ALT 17/17 Total bili 0.5 Alk phos 59 PSA 1.9 HIV negative   Justice Britain, MD [facility] Advanced Endoscopy Office # [phone]

## 2018-01-24 NOTE — Addendum Note (Signed)
Addended by: Corliss Parish on: 01/24/2018 08:17 AM   Modules accepted: Orders

## 2018-01-24 NOTE — Progress Notes (Signed)
Spoke with patient about results of elevation in MCV. Although his hemoglobin is normal we will check for B12 and folate deficiencies. I will order those labs and he will come in in the next few weeks to have that as well as a blood count repeated. His Cologuard has been shipped off within the last 24 hours.

## 2018-01-27 ENCOUNTER — Other Ambulatory Visit (INDEPENDENT_AMBULATORY_CARE_PROVIDER_SITE_OTHER): Payer: BLUE CROSS/BLUE SHIELD

## 2018-01-27 DIAGNOSIS — R718 Other abnormality of red blood cells: Secondary | ICD-10-CM | POA: Diagnosis not present

## 2018-01-27 LAB — CBC
HEMATOCRIT: 42.2 % (ref 39.0–52.0)
Hemoglobin: 14.9 g/dL (ref 13.0–17.0)
MCHC: 35.4 g/dL (ref 30.0–36.0)
MCV: 98.8 fl (ref 78.0–100.0)
PLATELETS: 291 10*3/uL (ref 150.0–400.0)
RBC: 4.27 Mil/uL (ref 4.22–5.81)
RDW: 12.6 % (ref 11.5–15.5)
WBC: 8.9 10*3/uL (ref 4.0–10.5)

## 2018-01-27 LAB — FOLATE: FOLATE: 8.6 ng/mL (ref >5.9)

## 2018-01-27 LAB — VITAMIN B12: VITAMIN B 12: 299 pg/mL (ref 211–911)

## 2018-01-30 ENCOUNTER — Encounter: Payer: Self-pay | Admitting: Gastroenterology

## 2018-02-14 ENCOUNTER — Other Ambulatory Visit: Payer: Self-pay

## 2018-02-14 ENCOUNTER — Telehealth: Payer: Self-pay | Admitting: Gastroenterology

## 2018-02-14 LAB — COLOGUARD: COLOGUARD: NEGATIVE

## 2018-02-14 NOTE — Telephone Encounter (Signed)
Left voicemail for patient to give call back to let them know the results of his Cologuard. The Cologuard was negative. The plan would be for a Cologuard to be repeated in 3 years for continued colon cancer screening unless the patient were to develop symptoms that would require [place] to reevaluate need for endoscopic evaluation. Await for his call back.   Corliss Parish, MD [facility] Advanced Endoscopy Office # [phone]

## 2018-02-15 ENCOUNTER — Encounter: Payer: Self-pay | Admitting: Gastroenterology

## 2018-02-15 NOTE — Telephone Encounter (Signed)
Able to speak with patient about the results of his Cologuard.  They were negative.  He is happy and thankful for this. Plan for repeat Cologuard in 3 years based on current guidelines. Patient asked about consideration of Cologuard testing yearly and I discussed with him that it may not be unreasonable to consider however would likely not be covered by insurance on off years. We will see if when the [place] preventative task force guidelines come out this changes otherwise we will discuss with patient in future as to what he would like to do.  Corliss Parish, MD [facility] Advanced Endoscopy Office # [phone]

## 2018-05-18 DIAGNOSIS — Z23 Encounter for immunization: Secondary | ICD-10-CM | POA: Diagnosis not present

## 2018-12-23 DIAGNOSIS — M25522 Pain in left elbow: Secondary | ICD-10-CM | POA: Diagnosis not present

## 2018-12-25 ENCOUNTER — Other Ambulatory Visit: Payer: Self-pay

## 2018-12-25 DIAGNOSIS — Z20822 Contact with and (suspected) exposure to covid-19: Secondary | ICD-10-CM

## 2018-12-25 DIAGNOSIS — R6889 Other general symptoms and signs: Secondary | ICD-10-CM | POA: Diagnosis not present

## 2018-12-27 LAB — NOVEL CORONAVIRUS, NAA: SARS-CoV-2, NAA: NOT DETECTED

## 2018-12-28 DIAGNOSIS — M25522 Pain in left elbow: Secondary | ICD-10-CM | POA: Diagnosis not present

## 2019-01-31 DIAGNOSIS — Z0000 Encounter for general adult medical examination without abnormal findings: Secondary | ICD-10-CM | POA: Diagnosis not present

## 2019-01-31 DIAGNOSIS — Z125 Encounter for screening for malignant neoplasm of prostate: Secondary | ICD-10-CM | POA: Diagnosis not present

## 2019-02-06 DIAGNOSIS — Z118 Encounter for screening for other infectious and parasitic diseases: Secondary | ICD-10-CM | POA: Diagnosis not present

## 2019-02-08 DIAGNOSIS — Z681 Body mass index (BMI) 19 or less, adult: Secondary | ICD-10-CM | POA: Diagnosis not present

## 2019-02-08 DIAGNOSIS — Z23 Encounter for immunization: Secondary | ICD-10-CM | POA: Diagnosis not present

## 2019-02-08 DIAGNOSIS — Z1211 Encounter for screening for malignant neoplasm of colon: Secondary | ICD-10-CM | POA: Diagnosis not present

## 2019-02-08 DIAGNOSIS — Z0000 Encounter for general adult medical examination without abnormal findings: Secondary | ICD-10-CM | POA: Diagnosis not present

## 2019-03-02 DIAGNOSIS — Z125 Encounter for screening for malignant neoplasm of prostate: Secondary | ICD-10-CM | POA: Diagnosis not present

## 2019-03-19 DIAGNOSIS — R972 Elevated prostate specific antigen [PSA]: Secondary | ICD-10-CM | POA: Diagnosis not present

## 2019-04-17 DIAGNOSIS — M6281 Muscle weakness (generalized): Secondary | ICD-10-CM | POA: Diagnosis not present

## 2019-04-17 DIAGNOSIS — M25522 Pain in left elbow: Secondary | ICD-10-CM | POA: Diagnosis not present

## 2019-04-17 DIAGNOSIS — M7712 Lateral epicondylitis, left elbow: Secondary | ICD-10-CM | POA: Diagnosis not present

## 2019-04-23 ENCOUNTER — Ambulatory Visit: Payer: HRSA Program | Attending: Internal Medicine

## 2019-04-23 DIAGNOSIS — Z20828 Contact with and (suspected) exposure to other viral communicable diseases: Secondary | ICD-10-CM | POA: Diagnosis not present

## 2019-04-23 DIAGNOSIS — Z20822 Contact with and (suspected) exposure to covid-19: Secondary | ICD-10-CM

## 2019-04-25 LAB — NOVEL CORONAVIRUS, NAA: SARS-CoV-2, NAA: NOT DETECTED

## 2019-04-30 ENCOUNTER — Telehealth: Payer: Self-pay | Admitting: Gastroenterology

## 2019-04-30 NOTE — Telephone Encounter (Signed)
The pt states he has not had a BM since Christmas Eve.  He is a paraplegic and states this has happened once before.  He has not been seen in our office since 12/2017.  I offered an appt with an extender and he declined.  He did say that he has an appt with PCP tomorrow and will keep that app and call back if needed. I offered to make him an appt to follow up with Dr Meridee Score but he also declined.  He will call back if need be.

## 2019-05-01 DIAGNOSIS — K5900 Constipation, unspecified: Secondary | ICD-10-CM | POA: Diagnosis not present

## 2019-05-01 DIAGNOSIS — M419 Scoliosis, unspecified: Secondary | ICD-10-CM | POA: Diagnosis not present

## 2019-05-01 DIAGNOSIS — G8220 Paraplegia, unspecified: Secondary | ICD-10-CM | POA: Diagnosis not present

## 2019-05-08 DIAGNOSIS — Z8669 Personal history of other diseases of the nervous system and sense organs: Secondary | ICD-10-CM | POA: Diagnosis not present

## 2019-05-08 DIAGNOSIS — G8220 Paraplegia, unspecified: Secondary | ICD-10-CM | POA: Diagnosis not present

## 2019-05-08 DIAGNOSIS — K5900 Constipation, unspecified: Secondary | ICD-10-CM | POA: Diagnosis not present

## 2019-12-21 DIAGNOSIS — Z23 Encounter for immunization: Secondary | ICD-10-CM | POA: Diagnosis not present

## 2020-02-01 DIAGNOSIS — Z1159 Encounter for screening for other viral diseases: Secondary | ICD-10-CM | POA: Diagnosis not present

## 2020-02-01 DIAGNOSIS — Z0000 Encounter for general adult medical examination without abnormal findings: Secondary | ICD-10-CM | POA: Diagnosis not present

## 2020-02-01 DIAGNOSIS — Z125 Encounter for screening for malignant neoplasm of prostate: Secondary | ICD-10-CM | POA: Diagnosis not present

## 2020-02-13 DIAGNOSIS — Z0000 Encounter for general adult medical examination without abnormal findings: Secondary | ICD-10-CM | POA: Diagnosis not present

## 2020-02-13 DIAGNOSIS — Z23 Encounter for immunization: Secondary | ICD-10-CM | POA: Diagnosis not present

## 2020-03-01 DIAGNOSIS — R509 Fever, unspecified: Secondary | ICD-10-CM | POA: Diagnosis not present

## 2020-04-25 DIAGNOSIS — Z03818 Encounter for observation for suspected exposure to other biological agents ruled out: Secondary | ICD-10-CM | POA: Diagnosis not present

## 2020-08-22 DIAGNOSIS — Z23 Encounter for immunization: Secondary | ICD-10-CM | POA: Diagnosis not present

## 2020-09-04 DIAGNOSIS — Z23 Encounter for immunization: Secondary | ICD-10-CM | POA: Diagnosis not present

## 2020-09-04 DIAGNOSIS — B359 Dermatophytosis, unspecified: Secondary | ICD-10-CM | POA: Diagnosis not present

## 2020-09-23 DIAGNOSIS — B354 Tinea corporis: Secondary | ICD-10-CM | POA: Diagnosis not present

## 2020-09-23 DIAGNOSIS — B359 Dermatophytosis, unspecified: Secondary | ICD-10-CM | POA: Diagnosis not present

## 2020-10-17 DIAGNOSIS — N390 Urinary tract infection, site not specified: Secondary | ICD-10-CM | POA: Diagnosis not present

## 2020-10-21 DIAGNOSIS — B359 Dermatophytosis, unspecified: Secondary | ICD-10-CM | POA: Diagnosis not present

## 2020-11-18 DIAGNOSIS — M419 Scoliosis, unspecified: Secondary | ICD-10-CM | POA: Diagnosis not present

## 2020-11-18 DIAGNOSIS — Z7185 Encounter for immunization safety counseling: Secondary | ICD-10-CM | POA: Diagnosis not present

## 2020-11-18 DIAGNOSIS — B359 Dermatophytosis, unspecified: Secondary | ICD-10-CM | POA: Diagnosis not present

## 2020-11-18 DIAGNOSIS — Z23 Encounter for immunization: Secondary | ICD-10-CM | POA: Diagnosis not present

## 2020-12-22 DIAGNOSIS — B354 Tinea corporis: Secondary | ICD-10-CM | POA: Diagnosis not present

## 2020-12-22 DIAGNOSIS — L89159 Pressure ulcer of sacral region, unspecified stage: Secondary | ICD-10-CM | POA: Diagnosis not present

## 2021-01-09 ENCOUNTER — Encounter: Payer: Self-pay | Admitting: Physical Medicine and Rehabilitation

## 2021-01-30 ENCOUNTER — Telehealth: Payer: Self-pay | Admitting: Gastroenterology

## 2021-01-30 NOTE — Telephone Encounter (Signed)
Called to discuss with patient colon cancer screening.  In 2019 he underwent Cologuard testing and that was negative.  This is his 3-year recall. Patient remains asymptomatic and is still wheelchair-bound with known paraplegia.  Thus preparation is more difficult for this patient. He would like to continue Cologuard testing for now but understands the downsides to Cologuard in regards to false negatives and false positives. If the Cologuard returns positive he understands role for colonoscopy thereafter. He would like to set up a follow-up in clinic for December or January and we will set that up. Cologuard can be ordered now.  Corliss Parish, MD [facility] Advanced Endoscopy Office # [phone]

## 2021-02-16 DIAGNOSIS — Z0000 Encounter for general adult medical examination without abnormal findings: Secondary | ICD-10-CM | POA: Diagnosis not present

## 2021-02-16 DIAGNOSIS — Z125 Encounter for screening for malignant neoplasm of prostate: Secondary | ICD-10-CM | POA: Diagnosis not present

## 2021-02-18 ENCOUNTER — Telehealth: Payer: Self-pay

## 2021-02-18 DIAGNOSIS — Z1211 Encounter for screening for malignant neoplasm of colon: Secondary | ICD-10-CM

## 2021-02-18 NOTE — Telephone Encounter (Signed)
Called to discuss with patient colon cancer screening.  In 2019 he underwent Cologuard testing and that was negative.  This is his 3-year recall. Patient remains asymptomatic and is still wheelchair-bound with known paraplegia.  Thus preparation is more difficult for this patient. He would like to continue Cologuard testing for now but understands the downsides to Cologuard in regards to false negatives and false positives. If the Cologuard returns positive he understands role for colonoscopy thereafter. He would like to set up a follow-up in clinic for December or January and we will set that up. Cologuard can be ordered now.  Gabriel Mansouraty, MD [facility] Advanced Endoscopy Office # [phone]  

## 2021-03-03 DIAGNOSIS — Z1211 Encounter for screening for malignant neoplasm of colon: Secondary | ICD-10-CM | POA: Diagnosis not present

## 2021-03-08 LAB — COLOGUARD: COLOGUARD: NEGATIVE

## 2021-03-09 ENCOUNTER — Encounter: Payer: Self-pay | Admitting: Gastroenterology

## 2021-03-09 DIAGNOSIS — Z0000 Encounter for general adult medical examination without abnormal findings: Secondary | ICD-10-CM | POA: Diagnosis not present

## 2021-03-09 DIAGNOSIS — Z23 Encounter for immunization: Secondary | ICD-10-CM | POA: Diagnosis not present

## 2021-03-23 ENCOUNTER — Encounter: Payer: Self-pay | Admitting: Physical Medicine and Rehabilitation

## 2021-03-23 ENCOUNTER — Encounter
Payer: BC Managed Care – PPO | Attending: Physical Medicine and Rehabilitation | Admitting: Physical Medicine and Rehabilitation

## 2021-03-23 ENCOUNTER — Other Ambulatory Visit: Payer: Self-pay

## 2021-03-23 VITALS — BP 135/82 | HR 66 | Ht 72.0 in | Wt 133.0 lb

## 2021-03-23 DIAGNOSIS — M4126 Other idiopathic scoliosis, lumbar region: Secondary | ICD-10-CM | POA: Insufficient documentation

## 2021-03-23 NOTE — Patient Instructions (Signed)
Pt is a 55 yr old male with hx of paraplegia- since 1991- fell off a balcony-  Is T9- complete-   Here for evaluation of SCI and to take over management.    Doesn't want ROHO cushion- really wobbly-felt unstable- uses J-extreme/active- is 55 years old, so will write Rx for new w/c cushion.  Needs new w/c cushion because his is >42 years old.  Needs new w/c - manual ultra light w/c due to his being 55 years old- is a T9 complete paraplegia. Needs for w/c propulsion- needs for reduction of weight on shoulders since been SCI for 30 years.  Tennis elbow- epicondylitis B/L - Has tried Epicondylitis clasps- but not effective- We can think about the nitroglycerin patches AND/OR trigger point injections to relax the tendon/musculature.  B/L shoulder pain- Suggest a different pillow- to have shoulder and neck at 90 degrees- so doesn't scrunch shoulder.brachial plexus. Before make a medical change, let's try and environmental change.  Do scoliosis films- [address] [facility]- walk in whenever you want.  Spasticity- Not on spasticity meds. No need for spasticity meds. Even prn.   9.  Sexuality- will discuss more at next appointment. Has 2 teenagers.   10. F/U in 3 months- double appointment.

## 2021-03-23 NOTE — Progress Notes (Signed)
Subjective:    Patient ID: Charles Hansen, male    DOB: 12-09-1965, 55 y.o.   MRN: [id number]  HPI  Pt is a 55 yr old male with hx of paraplegia- since 1991- fell off a balcony-  Is T9- complete-   Here for evaluation of SCI and to take over management.   No AD Sx's ever.   Has sweating below level of lesion- had initially for >6 months then went away, until lately- last couple of years.  Also gooseflesh- will be an indication of some type of injury- Has autonomic dysfunction- almost chronically.   Bladder- caths 6x/day or so- not closed system catheters-  Last UTI- 3-4 years ago. Used very good clean method.  Stopped having them. No cranberry pills, etc- used to have a few UTI's year; hasn't had one for 3-4 years.  Very rare accidents.   Bowel- dig stim 1x/day- afternoon- around 4-6 pm- not after a meal.  Doesn't have accidents often- rarely- no even minor accidents lately.   3-4 years ago- switched to qday dig stim- because went 10+ days without BM.  Takes Colace daily right now- way more consistent-   Have also increased water intake, esp in the mornings.   Skin- 2 skin sores in 30 years- 1 still recovering from- first one in 2018- couldn't get to go away- has some scoliosis-  and rotation- was sitting on inside of tub daily.  2nd one- flew back and form to [place] and long drives to [place]-  A little sore at the bottom of coccyx- from jeans- healing- not open anymore.    From original injury- had T4-L2 fusion from original injury.  Has convex Left curvature of Lumbar spine- apex at L2/3.  Also has L4/5 decompression due to Sweating to try and reduce the sweating.    Wants to figure out what to do about lower back-  Trading mobility for stability.  Questions about aging with disability.   Having soft tissue issues in B/L elbows- golfer's elbow on R and tennis elbow on L elbow.  Best thing they used was Nitroglycerin patches- Didn't try steroid injections,but tried many other  techniques etc- used U/S- already massage, a lot of therapy- didn't try dry needling.  Tried NTG patches- to increase blood flow to the area- before and after increased blood flow to the area- 2 years ago.  Didn't have the headaches from NTG- 1/4 patch 1x/day.  Flaring up a little-   Occ B/L shoulder pain- sleeps on sides-  Will wake up and hands will be numb- Occ. Spends 10-15 minutes on stretching shoulders-    W/C-  W/C is about 5 yrs- Marine scientist sport- custom w/c out of [employer]- magnesium w/c- J-active/extreme- - cushion- 49-54 years old.  Used [employer].    Spasticity- has it, but doesn't take meds for it-   Pain Inventory Average Pain 1 Pain Right Now 1   LOCATION OF PAIN  elbow  BOWEL Number of stools per week: 7 Oral laxative use Yes  Type of laxative colace  BLADDER  In and out cath, frequency 6x day Able to self cath Yes   Mobility ability to climb steps?  no do you drive?  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  no  Physicians involved in your care Any changes since last visit?  no  Family History  Problem Relation Age of Onset   Arthritis-Osteo Mother        Living, 45   Macular degeneration Mother    Arthritis-Osteo Father        Living, 45   Prostate cancer Brother    Heart disease Paternal Grandfather    Hyperlipidemia Brother    Healthy Sister    Healthy Son    Healthy Daughter    Colon cancer Neg Hx    Colon polyps Neg Hx    Esophageal cancer Neg Hx    Inflammatory bowel disease Neg Hx    Liver disease Neg Hx    Pancreatic cancer Neg Hx    Rectal cancer Neg Hx    Stomach cancer Neg Hx    Social History   Socioeconomic History   Marital status: Married    Spouse name: Not on file   Number of children: 2   Years of education: Not on file   Highest education level: Not on  file  Occupational History   Occupation: disabled  Tobacco Use   Smoking status: Never   Smokeless tobacco: Never  Substance and Sexual Activity   Alcohol use: Yes    Alcohol/week: 0.0 standard drinks    Comment: 2-3 glasses daily    Drug use: No   Sexual activity: Yes  Other Topics Concern   Not on file  Social History Narrative   Lives with wife and 2 kids in a one story home.     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   Social Determinants of Health   Financial Resource Strain: Not on file  Food Insecurity: Not on file  Transportation Needs: Not on file  Physical Activity: Not on file  Stress: Not on file  Social Connections: Not on file   Past Surgical History:  Procedure Laterality Date   FRACTURE SURGERY     HERNIA REPAIR     as a baby   LUMBAR LAMINECTOMY/DECOMPRESSION MICRODISCECTOMY N/A 06/11/2015   Procedure: Lumbar 4-5 decompression;  Surgeon: Estill Bamberg, MD;  Location: [facility];  Service: Orthopedics;  Laterality: N/A;  Lumbar 4-5 decompression   right index finger surgery     T9-T10 laminectomy and fusion      Past Medical History:  Diagnosis Date   Back pain    stenosis   Bladder stone    History of blood transfusion    History of bronchitis    Paraplegia (HCC)    T9   Weakness    numbness and tingling in hands   BP 135/82   Pulse 66   Ht 6' (1.829 m)   Wt 133 lb (60.3 kg) Comment: plus wheelchair weighs 22.2 lb (155.2 lb total)  SpO2 98%   BMI 18.04 kg/m   Opioid Risk Score:   Fall Risk Score:  `1  Depression screen PHQ 2/9  Depression screen [facility] 2/9 03/23/2021 07/23/2015 02/20/2015 09/09/2014 10/22/2013  Decreased Interest 0 0 0 0 0  Down, Depressed, Hopeless 0 0 0 0 0  PHQ - 2 Score 0 0 0 0 0  Altered sleeping 0 - - - -  Tired, decreased energy 0 - - - -  Change in appetite 0 - - - -  Feeling bad or failure about yourself  0 - - - -  Trouble concentrating 0 - - - -  Moving slowly or  fidgety/restless 0 - - - -  Suicidal thoughts 0 - - - -  PHQ-9 Score 0 - - - -     Review of Systems  Constitutional:  Positive for diaphoresis.  HENT: Negative.    Eyes: Negative.   Respiratory: Negative.    Cardiovascular: Negative.   Gastrointestinal: Negative.   Endocrine: Negative.   Genitourinary:        I&O cath  Musculoskeletal: Negative.   Skin: Negative.   Allergic/Immunologic: Negative.   Neurological: Negative.   Hematological: Negative.   Psychiatric/Behavioral: Negative.    All other systems reviewed and are negative.     Objective:   Physical Exam  Appears tilted slightly. Awake, alert, appropriate, in manual w/c, NAD  MS: 5/5 in UE B/L  0/5 B/L in LE's Lacking 5-10 degrees of knee extension at rest  Neuro: Sensory level T9 B/L  Absent T10 and below B/L  2-3 beats clonus in LE's B/L and a few spasms with simple ROM- not severe.         Assessment & Plan:   Pt is a 55 yr old male with hx of paraplegia- since 1991- fell off a balcony-  Is T9- complete-   Here for evaluation of SCI and to take over management.    Doesn't want ROHO cushion- really wobbly-felt unstable- uses J-extreme/active- is 54 years old, so will write Rx for new w/c cushion.  Needs new w/c cushion because his is >73 years old.  Needs new w/c - manual ultra light w/c due to his being 55 years old- is a T9 complete paraplegia. Needs for w/c propulsion- needs for reduction of weight on shoulders since been SCI for 30 years.  Tennis elbow- epicondylitis B/L - Has tried Epicondylitis clasps- but not effective- We can think about the nitroglycerin patches AND/OR trigger point injections to relax the tendon/musculature.  B/L shoulder pain- Suggest a different pillow- to have shoulder and neck at 90 degrees- so doesn't scrunch shoulder.brachial plexus. Before make a medical change, let's try and environmental change.  Do scoliosis films- [address] [place]- walk in  whenever you want.  Spasticity- Not on spasticity meds. No need for spasticity meds. Even prn.   9.  Sexuality- will discuss more at next appointment. Has 2 teenagers.   10. F/U in 3 months- double appointment.    I spent a total of 62 minutes on total visit-called Jason from [place]- and discussing scoliosis- will call pt about this when films back.

## 2021-03-30 ENCOUNTER — Ambulatory Visit
Admission: RE | Admit: 2021-03-30 | Discharge: 2021-03-30 | Disposition: A | Discharge status: Home / Self Care | Payer: BC Managed Care – PPO | Source: Ambulatory Visit | Attending: Physical Medicine and Rehabilitation | Admitting: Physical Medicine and Rehabilitation

## 2021-03-30 ENCOUNTER — Other Ambulatory Visit: Payer: Self-pay

## 2021-03-30 DIAGNOSIS — M4126 Other idiopathic scoliosis, lumbar region: Secondary | ICD-10-CM

## 2021-03-30 DIAGNOSIS — M8588 Other specified disorders of bone density and structure, other site: Secondary | ICD-10-CM | POA: Diagnosis not present

## 2021-06-29 ENCOUNTER — Encounter
Payer: BC Managed Care – PPO | Attending: Physical Medicine and Rehabilitation | Admitting: Physical Medicine and Rehabilitation

## 2021-10-12 ENCOUNTER — Encounter: Payer: Self-pay | Admitting: Physical Medicine and Rehabilitation

## 2021-10-12 ENCOUNTER — Encounter
Payer: BC Managed Care – PPO | Attending: Physical Medicine and Rehabilitation | Admitting: Physical Medicine and Rehabilitation

## 2021-10-12 VITALS — BP 127/81 | HR 58

## 2021-10-12 DIAGNOSIS — R252 Cramp and spasm: Secondary | ICD-10-CM | POA: Diagnosis not present

## 2021-10-12 DIAGNOSIS — Z993 Dependence on wheelchair: Secondary | ICD-10-CM | POA: Insufficient documentation

## 2021-10-12 DIAGNOSIS — G8220 Paraplegia, unspecified: Secondary | ICD-10-CM | POA: Diagnosis not present

## 2021-10-12 NOTE — Progress Notes (Signed)
Subjective:    Patient ID: Charles Hansen, male    DOB: January 17, 1966, 56 y.o.   MRN: [id number]  HPI Pt is a 56 yr old male with hx of paraplegia- since 1991- fell off a balcony-  Is T9- complete- with neurogenic bowel and bladder.  Here for f/u on SCI.    Didn't get the new w/c cushion- didn't get a call.   Coccyx pressure ulcer- has finally scabbed over- Just due to [employer] jeans- got last year. And ifnally realized it, so is finally healing.   Hasn't gone back to Ortho Epicondylitis is going "fine". Occ aggravating esp with certain weight lifting exercises.    Sometimes feel like having periodic spasms that didn't have before. Concern about [place] compression-  Intermittent- esp at night- wakes up if sleeps on side- nearly in fetal position.  If sits still too long, will start twitching- can also be triggered by certain movements- nothing particular.   Had thoracic and lumbar CT in past due to lumbar degeneration and scoliosis- wondering if this is due to compression lower down.      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  no  Physicians involved in your care Any changes since last visit?  no   Family History  Problem Relation Age of Onset   Arthritis-Osteo Mother        Living, 6   Macular degeneration Mother    Arthritis-Osteo  Father        Living, 22   Prostate cancer Brother    Heart disease Paternal Grandfather    Hyperlipidemia Brother    Healthy Sister    Healthy Son    Healthy Daughter    Colon cancer Neg Hx    Colon polyps Neg Hx    Esophageal cancer Neg Hx    Inflammatory bowel disease Neg Hx    Liver disease Neg Hx    Pancreatic cancer Neg Hx    Rectal cancer Neg Hx    Stomach cancer Neg Hx    Social History   Socioeconomic History   Marital status: Married    Spouse name: Not on file   Number of children: 2   Years of education: Not on file   Highest education level: Not on file  Occupational History   Occupation: disabled  Tobacco Use   Smoking status: Never   Smokeless tobacco: Never  Substance and Sexual Activity   Alcohol use: Yes    Alcohol/week: 0.0 standard drinks of alcohol    Comment: 2-3 glasses daily    Drug use: No   Sexual activity: Yes  Other Topics Concern   Not on file  Social History Narrative   Lives with wife and 2 kids in a one story home.     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   Social Determinants of Health   Financial Resource Strain: Not on file  Food Insecurity: Not on file  Transportation Needs: Not on file  Physical Activity: Not on file  Stress: Not on file  Social Connections: Not on file   Past Surgical History:  Procedure Laterality Date   FRACTURE SURGERY     HERNIA REPAIR     as a baby   LUMBAR LAMINECTOMY/DECOMPRESSION MICRODISCECTOMY N/A 06/11/2015   Procedure: Lumbar 4-5 decompression;  Surgeon: Estill Bamberg, MD;  Location: [facility];  Service: Orthopedics;  Laterality: N/A;  Lumbar 4-5 decompression   right index finger surgery     T9-T10 laminectomy and fusion      Past Medical History:  Diagnosis Date   Back pain    stenosis   Bladder stone    History of blood transfusion    History of bronchitis    Paraplegia (HCC)    T9   Weakness    numbness and tingling in hands   BP  127/81   Pulse (!) 58   SpO2 96%   Opioid Risk Score:   Fall Risk Score:  `1  Depression screen [facility] 2/9     10/12/2021   10:01 AM 03/23/2021    2:24 PM 07/23/2015   12:38 PM 02/20/2015   11:00 AM 09/09/2014    2:19 PM 10/22/2013    1:04 PM  Depression screen PHQ 2/9  Decreased Interest 0 0 0 0 0 0  Down, Depressed, Hopeless 0 0 0 0 0 0  PHQ - 2 Score 0 0 0 0 0 0  Altered sleeping  0      Tired, decreased energy  0      Change in appetite  0      Feeling bad or failure about yourself   0      Trouble concentrating  0      Moving slowly or fidgety/restless  0      Suicidal thoughts  0      PHQ-9 Score  0         Review of Systems  Musculoskeletal:  Positive for back pain and neck pain.       Shoulder pain  All other systems reviewed and are negative.     Objective:   Physical Exam  Awake, alert, hunched over slightly in w/c; NAD  Lacking knee extension 10-15 degrees- B/L  Neuro: 3-5 beats clonus in LE's MAS of 2 in LE's LLE is 2-3 and RLE is MAS of 2 slightly slightly less       Assessment & Plan:   Pt is a 56 yr old male with hx of paraplegia- since 1991- fell off a balcony-  Is T9- complete- with neurogenic bowel and bladder.  Here for f/u on SCI. Has new spasticity which is concerning.    [employer] [phone]. [employer]. Wrote Rx for new cushion and new w/c. - For w/c cushion.  Cushion is almost 55 years old now and w/c is >5 years-  Needing to repair frequently- tired bald; casters bald; brakes and replaced the entire foot well- just breaking down and frame slightly bent. Also needs upholstery redone again-   2.  Tyler twist for Tennis elbow- uses it already.   3. MRI of Thoracic and lumbar spine- due to new spasticity as well as lumbar spinal stenosis surgery 6-7 yrs ago- due to severe degeneration. With increasing back pain-  so need to get due to risk of syrinx or additional spinal cord compression.   4. Discussed reasons would have increased  spasticity- compression of herniated disc or a syrinx. So need to get MRI as above to determine cause.    5. Get PCP to look at calcium score- which is looking at cardiovascular risk.Due to increased risk of cardiovascular issues in SCI patients. Time since injury more indicative than age.   6. F/U in 6 months. Double appointment   I spent a total of  37  minutes on total care today- >50% coordination of care- due to discussing w/c and increased risk of spasticity?

## 2021-10-12 NOTE — Patient Instructions (Signed)
Pt is a 56 yr old male with hx of paraplegia- since 1991- fell off a balcony-  Is T9- complete- with neurogenic bowel and bladder.  Here for f/u on SCI. Has new spasticity which is concerning.    [employer] [phone]. [employer]. Wrote Rx for new cushion and new w/c. - For w/c cushion.  Cushion is almost 55 years old now and w/c is >5 years-  Needing to repair frequently- tired bald; casters bald; brakes and replaced the entire foot well- just breaking down and frame slightly bent. Also needs upholstery redone again-   2.  Tyler twist for Tennis elbow- uses it already.   3. MRI of Thoracic and lumbar spine- due to new spasticity as well as lumbar spinal stenosis surgery 6-7 yrs ago- due to severe degeneration. With increasing back pain- so need to get due to risk of syrinx or additional spinal cord compression.   4. Discussed reasons would have increased spasticity- compression of herniated disc or a syrinx. So need to get MRI as above to determine cause.    5. Get PCP to look at calcium score- which is looking at cardiovascular risk.Due to increased risk of cardiovascular issues in SCI patients. Time since injury more indicative than age.   6. F/U in 6 months. Double appointment

## 2021-11-03 ENCOUNTER — Encounter: Payer: Self-pay | Admitting: Physical Medicine and Rehabilitation

## 2021-11-03 DIAGNOSIS — G8220 Paraplegia, unspecified: Secondary | ICD-10-CM

## 2021-11-03 DIAGNOSIS — M62838 Other muscle spasm: Secondary | ICD-10-CM

## 2021-11-17 ENCOUNTER — Encounter (HOSPITAL_COMMUNITY): Payer: Self-pay

## 2021-11-17 ENCOUNTER — Ambulatory Visit (HOSPITAL_COMMUNITY): Payer: BC Managed Care – PPO

## 2021-11-17 ENCOUNTER — Ambulatory Visit (HOSPITAL_COMMUNITY)
Admission: RE | Admit: 2021-11-17 | Discharge: 2021-11-17 | Disposition: A | Discharge status: Home / Self Care | Payer: BC Managed Care – PPO | Source: Ambulatory Visit | Attending: Physical Medicine and Rehabilitation | Admitting: Physical Medicine and Rehabilitation

## 2021-11-17 DIAGNOSIS — M40204 Unspecified kyphosis, thoracic region: Secondary | ICD-10-CM | POA: Diagnosis not present

## 2021-11-17 DIAGNOSIS — M62838 Other muscle spasm: Secondary | ICD-10-CM | POA: Diagnosis not present

## 2021-11-17 DIAGNOSIS — M48061 Spinal stenosis, lumbar region without neurogenic claudication: Secondary | ICD-10-CM | POA: Diagnosis not present

## 2021-11-17 DIAGNOSIS — G8220 Paraplegia, unspecified: Secondary | ICD-10-CM | POA: Insufficient documentation

## 2021-11-18 ENCOUNTER — Telehealth: Payer: Self-pay | Admitting: Physical Medicine and Rehabilitation

## 2021-11-18 DIAGNOSIS — G8220 Paraplegia, unspecified: Secondary | ICD-10-CM

## 2021-11-18 NOTE — Telephone Encounter (Signed)
Discussed with pt his MRI results- no specific cause for spasticity, however increased nociceptive response from lumbar spine could be cause of increased spasticity as compared to a syrinx or a herniated disc.  However lumbar spine looks significant- think it's appropriate for pt to see [facility] to discuss further- will refer to Dr Marcia Brash for that discussion.  Please double check with [facility] clinic if needs to take disc of MRI to appointment- let him know how to do that.  Explained to pt to let me know the plan.

## 2021-12-24 DIAGNOSIS — L570 Actinic keratosis: Secondary | ICD-10-CM | POA: Diagnosis not present

## 2021-12-25 DIAGNOSIS — M419 Scoliosis, unspecified: Secondary | ICD-10-CM | POA: Diagnosis not present

## 2022-02-10 DIAGNOSIS — Z23 Encounter for immunization: Secondary | ICD-10-CM | POA: Diagnosis not present

## 2022-03-03 DIAGNOSIS — Z0000 Encounter for general adult medical examination without abnormal findings: Secondary | ICD-10-CM | POA: Diagnosis not present

## 2022-03-03 DIAGNOSIS — Z114 Encounter for screening for human immunodeficiency virus [HIV]: Secondary | ICD-10-CM | POA: Diagnosis not present

## 2022-03-03 DIAGNOSIS — Z13220 Encounter for screening for lipoid disorders: Secondary | ICD-10-CM | POA: Diagnosis not present

## 2022-03-03 DIAGNOSIS — Z125 Encounter for screening for malignant neoplasm of prostate: Secondary | ICD-10-CM | POA: Diagnosis not present

## 2022-03-10 DIAGNOSIS — Z0000 Encounter for general adult medical examination without abnormal findings: Secondary | ICD-10-CM | POA: Diagnosis not present

## 2022-03-10 DIAGNOSIS — Z23 Encounter for immunization: Secondary | ICD-10-CM | POA: Diagnosis not present

## 2022-03-24 DIAGNOSIS — L578 Other skin changes due to chronic exposure to nonionizing radiation: Secondary | ICD-10-CM | POA: Diagnosis not present

## 2022-03-24 DIAGNOSIS — L859 Epidermal thickening, unspecified: Secondary | ICD-10-CM | POA: Diagnosis not present

## 2022-03-24 DIAGNOSIS — L821 Other seborrheic keratosis: Secondary | ICD-10-CM | POA: Diagnosis not present

## 2022-03-24 DIAGNOSIS — D225 Melanocytic nevi of trunk: Secondary | ICD-10-CM | POA: Diagnosis not present

## 2022-04-07 ENCOUNTER — Ambulatory Visit: Payer: BC Managed Care – PPO | Admitting: Physical Medicine and Rehabilitation

## 2022-06-06 DIAGNOSIS — Z681 Body mass index (BMI) 19 or less, adult: Secondary | ICD-10-CM | POA: Diagnosis not present

## 2022-06-06 DIAGNOSIS — R300 Dysuria: Secondary | ICD-10-CM | POA: Diagnosis not present

## 2022-06-06 DIAGNOSIS — R399 Unspecified symptoms and signs involving the genitourinary system: Secondary | ICD-10-CM | POA: Diagnosis not present

## 2022-07-02 ENCOUNTER — Encounter
Payer: BC Managed Care – PPO | Attending: Physical Medicine and Rehabilitation | Admitting: Physical Medicine and Rehabilitation

## 2022-07-02 ENCOUNTER — Encounter: Payer: Self-pay | Admitting: Physical Medicine and Rehabilitation

## 2022-07-02 VITALS — BP 125/76 | HR 64 | Ht 72.0 in | Wt 132.0 lb

## 2022-07-02 DIAGNOSIS — M7520 Bicipital tendinitis, unspecified shoulder: Secondary | ICD-10-CM | POA: Insufficient documentation

## 2022-07-02 DIAGNOSIS — G8220 Paraplegia, unspecified: Secondary | ICD-10-CM | POA: Diagnosis not present

## 2022-07-02 DIAGNOSIS — Z993 Dependence on wheelchair: Secondary | ICD-10-CM | POA: Insufficient documentation

## 2022-07-02 NOTE — Patient Instructions (Signed)
Pt is a 57 yr old male with hx of paraplegia- since 1991- fell off a balcony-  Is T9- complete- with neurogenic bowel and bladder.  Here for f/u on SCI. Also has mild B/L bicipital tendinitis   Ordered w/c from manufacturer- hasn't heard from [place] -  Will need to get new W?C_ his has bald tires, bald casters; brakes and entire foot well- and frame is actually bent- his is >45 years old. - also upholstery is completely gone. Will give new Rx- Called Corene Cornea about calling pt. Will send Corene Cornea Rx's  3. Needs w/c cushion- Ulice Dash extreme- his is 57 years old already.   4.  Get PCP to order calcium score- whic looks at cardiovascular risk- I unfortunately do not order, since this is not my expertise, but PCP or Cards usually order- since he can have increased CVD risk from have paraplegia.  Time since injury more indicative than age.  5. Went over ways to help shoulders- dicussed options- tumeric, glucosamine chondroitin , stretching- also discussed rotator cuff strengthening and stabilization- added exercises for pt. Also discussed power assist wheels- not interested right now.   6.  F/U in 6-12 months- double appt- SCI

## 2022-07-02 NOTE — Progress Notes (Signed)
Subjective:    Patient ID: Charles Hansen, male    DOB: Sep 23, 1965, 57 y.o.   MRN: [id number]  HPI  Pt is a 57 yr old male with hx of paraplegia- since 1991- fell off a balcony-  Is T9- complete- with neurogenic bowel and bladder.  Here for f/u on SCI.     Things good- no issues.    Didn't get calcium score   Starting to have persistent low grade pain- especially at night Sleeps on side Ordered a new mattress- is in house- but hasn't slept on yet- is memory foam.   Pain and discomfort is super "internal" in joint- and sometimes anterior bicipital tendinitis B/L shoulders.   Really good at health habits.    Pain Inventory Average Pain 1 Pain Right Now 1 My pain is  .  LOCATION OF PAIN  na  BOWEL Number of stools per week: 7 Oral laxative use No  Type of laxative . Enema or suppository use No  History of colostomy No  Incontinent No   BLADDER Foley In and out cath, frequency 6-8 Able to self cath Yes  Bladder incontinence No  Frequent urination No  Leakage with coughing No  Difficulty starting stream No  Incomplete bladder emptying No    Mobility ability to climb steps?  no do you drive?  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  no  Physicians involved in your care Any changes since last visit?  no   Family History  Problem Relation Age of Onset   Arthritis-Osteo Mother        Living, 8   Macular degeneration Mother    Arthritis-Osteo Father        Living, 71   Prostate cancer Brother    Heart disease Paternal Grandfather    Hyperlipidemia Brother    Healthy Sister    Healthy Son    Healthy Daughter    Colon cancer Neg Hx    Colon polyps Neg Hx    Esophageal cancer Neg Hx    Inflammatory bowel disease Neg Hx    Liver disease Neg Hx    Pancreatic cancer Neg Hx    Rectal cancer Neg Hx     Stomach cancer Neg Hx    Social History   Socioeconomic History   Marital status: Married    Spouse name: Not on file   Number of children: 2   Years of education: Not on file   Highest education level: Not on file  Occupational History   Occupation: disabled  Tobacco Use   Smoking status: Never   Smokeless tobacco: Never  Substance and Sexual Activity   Alcohol use: Yes    Alcohol/week: 0.0 standard drinks of alcohol    Comment: 2-3 glasses daily    Drug use: No   Sexual activity: Yes  Other Topics Concern   Not on file  Social History Narrative   Lives with wife and 2 kids in a one story home.     Self-employed as Solicitor.  Owns a couple of businesses.     Education: Oceanographer.   Social Determinants of Health   Financial Resource Strain: Not on file  Food Insecurity: Not on file  Transportation Needs: Not on file  Physical Activity: Not on file  Stress: Not on file  Social Connections: Not on file  Past Surgical History:  Procedure Laterality Date   FRACTURE SURGERY     HERNIA REPAIR     as a baby   LUMBAR LAMINECTOMY/DECOMPRESSION MICRODISCECTOMY N/A 06/11/2015   Procedure: Lumbar 4-5 decompression;  Surgeon: Phylliss Bob, MD;  Location: [place];  Service: Orthopedics;  Laterality: N/A;  Lumbar 4-5 decompression   right index finger surgery     T9-T10 laminectomy and fusion      Past Medical History:  Diagnosis Date   Back pain    stenosis   Bladder stone    History of blood transfusion    History of bronchitis    Paraplegia (HCC)    T9   Weakness    numbness and tingling in hands   BP 125/76   Pulse 64   Ht 6' (1.829 m)   Wt 132 lb (59.9 kg)   SpO2 98%   BMI 17.90 kg/m   Opioid Risk Score:   Fall Risk Score:  `1  Depression screen [facility] 2/9     10/12/2021   10:01 AM 03/23/2021    2:24 PM 07/23/2015   12:38 PM 02/20/2015   11:00 AM 09/09/2014    2:19 PM 10/22/2013    1:04 PM  Depression screen PHQ 2/9  Decreased Interest 0 0 0 0 0 0   Down, Depressed, Hopeless 0 0 0 0 0 0  PHQ - 2 Score 0 0 0 0 0 0  Altered sleeping  0      Tired, decreased energy  0      Change in appetite  0      Feeling bad or failure about yourself   0      Trouble concentrating  0      Moving slowly or fidgety/restless  0      Suicidal thoughts  0      PHQ-9 Score  0         Review of Systems An entire ROS was completed- negative except or HPI    Objective:   Physical Exam   Awake, alert, appropriate, in manual w/c, chair bent, NAD  No crepitus B/L  Full ROM of B/L shoulders 5/5 in Ue's Tendinitis of B/L biceps origin/anterior shoulder.       Assessment & Plan:   Pt is a 57 yr old male with hx of paraplegia- since 1991- fell off a balcony-  Is T9- complete- with neurogenic bowel and bladder.  Here for f/u on SCI. Also has mild B/L bicipital tendinitis   Ordered w/c from manufacturer- hasn't heard from [place] -  Will need to get new W?C_ his has bald tires, bald casters; brakes and entire foot well- and frame is actually bent- his is >66 years old. - also upholstery is completely gone. Will give new Rx- Called Corene Cornea about calling pt. Will send Corene Cornea Rx's  3. Needs w/c cushion- Ulice Dash extreme- his is 57 years old already.   4.  Get PCP to order calcium score- whic looks at cardiovascular risk- I unfortunately do not order, since this is not my expertise, but PCP or Cards usually order- since he can have increased CVD risk from have paraplegia.  Time since injury more indicative than age.  5. Went over ways to help shoulders- dicussed options- tumeric, glucosamine chondroitin , stretching- also discussed rotator cuff strengthening and stabilization- added exercises for pt. Also discussed power assist wheels- not interested right now.   6.  F/U in 6-12 months- double appt- SCI   I  spent a total of  31  minutes on total care today- >50% coordination of care- due to education on shoulder health- and calcium score.

## 2022-08-04 DIAGNOSIS — G8220 Paraplegia, unspecified: Secondary | ICD-10-CM | POA: Diagnosis not present

## 2022-12-28 DIAGNOSIS — M25512 Pain in left shoulder: Secondary | ICD-10-CM | POA: Diagnosis not present

## 2022-12-28 DIAGNOSIS — M25511 Pain in right shoulder: Secondary | ICD-10-CM | POA: Diagnosis not present

## 2023-01-28 DIAGNOSIS — N3000 Acute cystitis without hematuria: Secondary | ICD-10-CM | POA: Diagnosis not present

## 2023-01-28 DIAGNOSIS — Z23 Encounter for immunization: Secondary | ICD-10-CM | POA: Diagnosis not present

## 2023-03-09 DIAGNOSIS — Z125 Encounter for screening for malignant neoplasm of prostate: Secondary | ICD-10-CM | POA: Diagnosis not present

## 2023-03-09 DIAGNOSIS — Z114 Encounter for screening for human immunodeficiency virus [HIV]: Secondary | ICD-10-CM | POA: Diagnosis not present

## 2023-03-09 DIAGNOSIS — Z13220 Encounter for screening for lipoid disorders: Secondary | ICD-10-CM | POA: Diagnosis not present

## 2023-03-09 DIAGNOSIS — Z0000 Encounter for general adult medical examination without abnormal findings: Secondary | ICD-10-CM | POA: Diagnosis not present

## 2023-03-11 DIAGNOSIS — G8220 Paraplegia, unspecified: Secondary | ICD-10-CM | POA: Diagnosis not present

## 2023-03-11 DIAGNOSIS — Z0000 Encounter for general adult medical examination without abnormal findings: Secondary | ICD-10-CM | POA: Diagnosis not present

## 2023-03-31 DIAGNOSIS — L821 Other seborrheic keratosis: Secondary | ICD-10-CM | POA: Diagnosis not present

## 2023-03-31 DIAGNOSIS — D225 Melanocytic nevi of trunk: Secondary | ICD-10-CM | POA: Diagnosis not present

## 2023-03-31 DIAGNOSIS — L578 Other skin changes due to chronic exposure to nonionizing radiation: Secondary | ICD-10-CM | POA: Diagnosis not present

## 2023-03-31 DIAGNOSIS — L720 Epidermal cyst: Secondary | ICD-10-CM | POA: Diagnosis not present

## 2023-03-31 DIAGNOSIS — D485 Neoplasm of uncertain behavior of skin: Secondary | ICD-10-CM | POA: Diagnosis not present

## 2023-04-01 DIAGNOSIS — U071 COVID-19: Secondary | ICD-10-CM | POA: Diagnosis not present

## 2023-07-01 ENCOUNTER — Encounter: Payer: Self-pay | Admitting: Physical Medicine and Rehabilitation

## 2023-07-01 ENCOUNTER — Encounter
Payer: BC Managed Care – PPO | Attending: Physical Medicine and Rehabilitation | Admitting: Physical Medicine and Rehabilitation

## 2023-07-01 VITALS — BP 124/77 | HR 66 | Wt 129.0 lb

## 2023-07-01 DIAGNOSIS — Z993 Dependence on wheelchair: Secondary | ICD-10-CM | POA: Diagnosis not present

## 2023-07-01 DIAGNOSIS — G8220 Paraplegia, unspecified: Secondary | ICD-10-CM | POA: Diagnosis not present

## 2023-07-01 NOTE — Progress Notes (Signed)
 Subjective:    Patient ID: Charles Hansen, male    DOB: 11-May-1965, 58 y.o.   MRN: [id number]  HPI  Pt is a 58 yr old male with hx of paraplegia- since 1991- fell off a balcony-  Is T9- complete- with neurogenic bowel and bladder.  Here for f/u on SCI.     Things really good-  Status quo-   Found at dinner table with shoulder Ortho-   Shoulders doing well.  Nothing abnormal .   Didn't get any specific shoulder HEP.   Does morning HEP with shoulders as well.   Does therabands.  Does tyler twist.   1 UTI in last year-  Woke up with fever- no confusion- no hospitalization.      Uses coloplast 14 french  catheters it sounds like.  Pays straight out for them.   Didn't do MRI, but xrays look great.   On high deductible plan.   W/c and got together with Barbara Cower- on hold about that- comprehensive survey of what's out there.  Got new cushion-  L seat post in back broke- had to find a local fab shop to fix it.   Tried out carbon fiber w/c- was terrible design.   Better w/c in [place] than here right now.  [employer] looking to build center in [place]- for show room.    Will open this year.      Pain Inventory Average Pain 0 Pain Right Now 0 My pain is  No pain  In the last 24 hours, has pain interfered with the following? General activity 0 Relation with others 0 Enjoyment of life 0  Sleep (in general) Fair  Pain is worse with:  No pain Pain improves with:  No pain Relief from Meds:  No pain  Family History  Problem Relation Age of Onset   Arthritis-Osteo Mother        Living, 18   Macular degeneration Mother    Arthritis-Osteo Father        Living, 33   Prostate cancer Brother    Heart disease Paternal Grandfather    Hyperlipidemia Brother    Healthy Sister    Healthy Son    Healthy Daughter    Colon cancer Neg Hx    Colon polyps Neg Hx    Esophageal cancer Neg Hx    Inflammatory bowel disease Neg Hx    Liver disease Neg Hx    Pancreatic cancer  Neg Hx    Rectal cancer Neg Hx    Stomach cancer Neg Hx    Social History   Socioeconomic History   Marital status: Married    Spouse name: Not on file   Number of children: 2   Years of education: Not on file   Highest education level: Not on file  Occupational History   Occupation: disabled  Tobacco Use   Smoking status: Never   Smokeless tobacco: Never  Substance and Sexual Activity   Alcohol use: Yes    Alcohol/week: 0.0 standard drinks of alcohol    Comment: 2-3 glasses daily    Drug use: No   Sexual activity: Yes  Other Topics Concern   Not on file  Social History Narrative   Lives with wife and 2 kids in a one story home.     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   Social Drivers of Corporate investment banker Strain: Not on file  Food Insecurity: Not  on file  Transportation Needs: Not on file  Physical Activity: Not on file  Stress: Not on file  Social Connections: Not on file   Past Surgical History:  Procedure Laterality Date   FRACTURE SURGERY     HERNIA REPAIR     as a baby   LUMBAR LAMINECTOMY/DECOMPRESSION MICRODISCECTOMY N/A 06/11/2015   Procedure: Lumbar 4-5 decompression;  Surgeon: Estill Bamberg, MD;  Location: [facility] OR;  Service: Orthopedics;  Laterality: N/A;  Lumbar 4-5 decompression   right index finger surgery     T9-T10 laminectomy and fusion      Past Surgical History:  Procedure Laterality Date   FRACTURE SURGERY     HERNIA REPAIR     as a baby   LUMBAR LAMINECTOMY/DECOMPRESSION MICRODISCECTOMY N/A 06/11/2015   Procedure: Lumbar 4-5 decompression;  Surgeon: Estill Bamberg, MD;  Location: [facility] OR;  Service: Orthopedics;  Laterality: N/A;  Lumbar 4-5 decompression   right index finger surgery     T9-T10 laminectomy and fusion      Past Medical History:  Diagnosis Date   Back pain    stenosis   Bladder stone    History of blood transfusion    History of bronchitis    Paraplegia (HCC)    T9    Weakness    numbness and tingling in hands   BP 124/77   Pulse 66   SpO2 98%   Opioid Risk Score:   Fall Risk Score:  `1  Depression screen [facility] 2/9     07/01/2023    9:33 AM 10/12/2021   10:01 AM 03/23/2021    2:24 PM 07/23/2015   12:38 PM 02/20/2015   11:00 AM 09/09/2014    2:19 PM 10/22/2013    1:04 PM  Depression screen PHQ 2/9  Decreased Interest 0 0 0 0 0 0 0  Down, Depressed, Hopeless 0 0 0 0 0 0 0  PHQ - 2 Score 0 0 0 0 0 0 0  Altered sleeping   0      Tired, decreased energy   0      Change in appetite   0      Feeling bad or failure about yourself    0      Trouble concentrating   0      Moving slowly or fidgety/restless   0      Suicidal thoughts   0      PHQ-9 Score   0         Review of Systems  All other systems reviewed and are negative.      Objective:   Physical Exam Awake, alert, appropriate, in good position in manual w/c, NAD   Manual w/c in adquate condition, casters and tires look in good condition       Assessment & Plan:   Pt is a 58 yr old male with hx of paraplegia- since 1991- fell off a balcony-  Is T9- complete- with neurogenic bowel and bladder.  Here for f/u on SCI.    Do release of info form [facility] and have them send me a note of his appointment with you.   2.   We discussed fabrication for w/c- and  issues relating to shoulder.   3. F/U in 1 year.   4. Discussed inflammation as a s ource of CVD issues  I spent a total of    minutes on total care today- >50% coordination of care- due to education and discussion  as detailed above.

## 2023-07-01 NOTE — Patient Instructions (Signed)
 Pt is a 58 yr old male with hx of paraplegia- since 1991- fell off a balcony-  Is T9- complete- with neurogenic bowel and bladder.  Here for f/u on SCI.    Do release of info form [facility] and have them send me a note of his appointment with you.   2.   We discussed fabrication for w/c- and  issues relating to shoulder.   3. F/U in 1 year.

## 2023-07-15 ENCOUNTER — Ambulatory Visit (HOSPITAL_COMMUNITY)
Admission: RE | Admit: 2023-07-15 | Discharge: 2023-07-15 | Disposition: A | Discharge status: Home / Self Care | Source: Ambulatory Visit | Attending: Physical Medicine and Rehabilitation | Admitting: Physical Medicine and Rehabilitation

## 2023-07-15 DIAGNOSIS — G8220 Paraplegia, unspecified: Secondary | ICD-10-CM | POA: Insufficient documentation

## 2023-11-07 DIAGNOSIS — R21 Rash and other nonspecific skin eruption: Secondary | ICD-10-CM | POA: Diagnosis not present

## 2024-01-04 DIAGNOSIS — Z7185 Encounter for immunization safety counseling: Secondary | ICD-10-CM | POA: Diagnosis not present

## 2024-01-04 DIAGNOSIS — Z23 Encounter for immunization: Secondary | ICD-10-CM | POA: Diagnosis not present

## 2024-03-12 DIAGNOSIS — Z125 Encounter for screening for malignant neoplasm of prostate: Secondary | ICD-10-CM | POA: Diagnosis not present

## 2024-03-12 DIAGNOSIS — Z0000 Encounter for general adult medical examination without abnormal findings: Secondary | ICD-10-CM | POA: Diagnosis not present

## 2024-03-12 DIAGNOSIS — Z13220 Encounter for screening for lipoid disorders: Secondary | ICD-10-CM | POA: Diagnosis not present

## 2024-03-16 DIAGNOSIS — N390 Urinary tract infection, site not specified: Secondary | ICD-10-CM | POA: Diagnosis not present

## 2024-03-16 DIAGNOSIS — Z0000 Encounter for general adult medical examination without abnormal findings: Secondary | ICD-10-CM | POA: Diagnosis not present

## 2024-03-16 DIAGNOSIS — G8220 Paraplegia, unspecified: Secondary | ICD-10-CM | POA: Diagnosis not present

## 2024-04-30 ENCOUNTER — Ambulatory Visit (INDEPENDENT_AMBULATORY_CARE_PROVIDER_SITE_OTHER): Admitting: Physician Assistant

## 2024-04-30 ENCOUNTER — Encounter (INDEPENDENT_AMBULATORY_CARE_PROVIDER_SITE_OTHER): Payer: Self-pay | Admitting: Physician Assistant

## 2024-04-30 VITALS — BP 117/67 | HR 71 | Ht 72.0 in | Wt 130.0 lb

## 2024-04-30 DIAGNOSIS — H9193 Unspecified hearing loss, bilateral: Secondary | ICD-10-CM

## 2024-05-01 ENCOUNTER — Ambulatory Visit (INDEPENDENT_AMBULATORY_CARE_PROVIDER_SITE_OTHER)

## 2024-05-01 DIAGNOSIS — H903 Sensorineural hearing loss, bilateral: Secondary | ICD-10-CM

## 2024-05-01 NOTE — Progress Notes (Signed)
" °  [address] [place], [place] [address] [phone]  Audiological Evaluation    Name: Charles Hansen     DOB:   1965/07/09      MRN:   [id number]                                                                                     Service Date: 05/01/2024     Accompanied by: self    Patient comes today after Reyes Cohen, PA-C sent a referral for a hearing evaluation due to concerns with hearing loss and would like a baseline of hearing level.   Symptoms Yes Details  Hearing loss  [x]  Unsure  Tinnitus  []  No tinnitus  Ear pain/ infections/pressure  []  No ear pain  Balance problems  []  No dizziness  Noise exposure history  [x]  No occupational or recreational noise exposure     Previous ear surgeries  []    Family history of hearing loss  []    Amplification  []    Other  [x]  Back injury from a fall from a 2-story balcony to cement pavement     Otoscopy: Right ear: Clear external ear canal and notable landmarks visualized on the tympanic membrane. Left ear:  Clear external ear canal and notable landmarks visualized on the tympanic membrane.  Tympanometry: Right ear: Type A - Normal external ear canal volume with normal middle ear pressure and normal tympanic membrane compliance. Findings are consistent with normal middle ear function. Left ear: Type A - Normal external ear canal volume with normal middle ear pressure and normal tympanic membrane compliance. Findings are consistent with normal middle ear function.  Hearing Evaluation The hearing test results were completed under headphones and re-checked with inserts and results are deemed to be of good reliability. Test technique:  conventional    Pure tone Audiometry: Right ear- Normal hearing from 250 - 2000 Hz and at 6000 - 8000 Hz, mild sensorineural hearing loss 3000 - 4000 Hz. Left ear-  Normal hearing from 250 - 2000 Hz and at 8000 Hz, mild sensorineural hearing loss 3000 - 6000 Hz.  Speech  Audiometry: Right ear- Speech Reception Threshold (SRT) was obtained at 10 dBHL. Left ear-Speech Reception Threshold (SRT) was obtained at 15 dBHL.   Word Recognition Score Tested using NU-6 (recorded) Right ear: 96% was obtained at a presentation level of 65 dBHL with contralateral masking which is deemed as  excellent. Left ear: 100% was obtained at a presentation level of 65 dBHL with contralateral masking which is deemed as  excellent.   Impression: There is not a significant difference in pure-tone thresholds between ears., There is not a significant difference in the word recognition score in between ears.    Recommendations: Follow up with ENT as scheduled. Return for a hearing evaluation if concerns with hearing changes arise or per MD recommendation.   Arlean Rake, AUD  "

## 2024-05-01 NOTE — Progress Notes (Signed)
 Dear Dr. Waylan, Here is my assessment for our mutual patient, Charles Hansen. Thank you for allowing me the opportunity to care for your patient. Please do not hesitate to contact me should you have any other questions. Sincerely, Charles Cohen PA-C  Otolaryngology Clinic Note Referring provider: Dr. Waylan HPI:  Charles Hansen is a 59 y.o. male kindly referred by Dr. Waylan   Discussed the use of AI scribe software for clinical note transcription with the patient, who gave verbal consent to proceed.  History of Present Illness   Charles Hansen is a 59 year old male who presents for a baseline hearing evaluation.  He seeks a baseline audiologic assessment to establish a reference for future monitoring. He has not perceived any specific hearing loss but notes a subjective sense that he may be missing sounds he previously heard.  He denies otalgia, vertigo, tinnitus, otorrhea, or prior otologic surgery. He reports he has not worked around loud noises for a long period of time. He reports no issues with his throat or nose.           Independent Review of Additional Tests or Records:  PCP office note 07/02/2023   PMH/Meds/All/SocHx/FamHx/ROS:   Past Medical History:  Diagnosis Date   Back pain    stenosis   Bladder stone    History of blood transfusion    History of bronchitis    Paraplegia (HCC)    T9   Weakness    numbness and tingling in hands     Past Surgical History:  Procedure Laterality Date   FRACTURE SURGERY     HERNIA REPAIR     as a baby   LUMBAR LAMINECTOMY/DECOMPRESSION MICRODISCECTOMY N/A 06/11/2015   Procedure: Lumbar 4-5 decompression;  Surgeon: Oneil Priestly, MD;  Location: [facility] OR;  Service: Orthopedics;  Laterality: N/A;  Lumbar 4-5 decompression   right index finger surgery     T9-T10 laminectomy and fusion       Family History  Problem Relation Age of Onset   Arthritis-Osteo Mother        Living, 39   Macular degeneration Mother    Arthritis-Osteo  Father        Living, 79   Prostate cancer Brother    Heart disease Paternal Grandfather    Hyperlipidemia Brother    Healthy Sister    Healthy Son    Healthy Daughter    Colon cancer Neg Hx    Colon polyps Neg Hx    Esophageal cancer Neg Hx    Inflammatory bowel disease Neg Hx    Liver disease Neg Hx    Pancreatic cancer Neg Hx    Rectal cancer Neg Hx    Stomach cancer Neg Hx      Social Connections: Not on file     Current Medications[1]   Physical Exam:   BP 117/67   Pulse 71   Ht 6' (1.829 m)   Wt 130 lb (59 kg)   SpO2 96%   BMI 17.63 kg/m   Pertinent Findings  CN II-XII grossly intact Bilateral EAC clear and TM intact with well pneumatized middle ear spaces Anterior rhinoscopy: Septum midline; bilateral inferior turbinates with no hypertrophy No lesions of oral cavity/oropharynx; dentition wnl No obviously palpable neck masses/lymphadenopathy/thyromegaly No respiratory distress or stridor   Seprately Identifiable Procedures:  None  Impression & Plans:  Leul Narramore is a 59 y.o. male with the following   Assessment and Plan    Bilateral hearing loss Asymptomatic 59 year old male with  family history of hearing loss. Baseline audiometry needed for future comparison and early detection. - Ordered audio logical evaluation          - f/u phone call with audio if any concerning audio findings    Thank you for allowing me the opportunity to care for your patient. Please do not hesitate to contact me should you have any other questions.  Sincerely, Charles Cohen PA-C [facility] Phone: [phone] Fax: [phone]  05/01/2024, 11:46 AM        [1]  Current Outpatient Medications:    naproxen sodium (ALEVE) 220 MG tablet, Take 220 mg by mouth as needed., Disp: , Rfl:

## 2024-06-29 ENCOUNTER — Ambulatory Visit: Admitting: Physical Medicine and Rehabilitation
